# Patient Record
Sex: Male | Born: 1995 | Race: Black or African American | Hispanic: No | Marital: Single | Smoking: Never smoker
Health system: Southern US, Community
[De-identification: ages and names within clinical notes are randomized; demographics above are authoritative.]

## PROBLEM LIST (undated history)

## (undated) DIAGNOSIS — F32A Depression, unspecified: Secondary | ICD-10-CM

## (undated) DIAGNOSIS — F419 Anxiety disorder, unspecified: Secondary | ICD-10-CM

## (undated) DIAGNOSIS — F329 Major depressive disorder, single episode, unspecified: Secondary | ICD-10-CM

---

## 2017-09-01 ENCOUNTER — Encounter (HOSPITAL_COMMUNITY): Payer: Self-pay

## 2017-09-01 ENCOUNTER — Emergency Department (HOSPITAL_COMMUNITY)
Admission: EM | Admit: 2017-09-01 | Discharge: 2017-09-01 | Disposition: A | Discharge status: Home / Self Care | Payer: Self-pay | Attending: Emergency Medicine | Admitting: Emergency Medicine

## 2017-09-01 ENCOUNTER — Emergency Department (HOSPITAL_COMMUNITY): Payer: Self-pay

## 2017-09-01 DIAGNOSIS — F17200 Nicotine dependence, unspecified, uncomplicated: Secondary | ICD-10-CM | POA: Insufficient documentation

## 2017-09-01 DIAGNOSIS — R519 Headache, unspecified: Secondary | ICD-10-CM

## 2017-09-01 DIAGNOSIS — B9789 Other viral agents as the cause of diseases classified elsewhere: Secondary | ICD-10-CM

## 2017-09-01 DIAGNOSIS — R51 Headache: Secondary | ICD-10-CM | POA: Insufficient documentation

## 2017-09-01 DIAGNOSIS — R0981 Nasal congestion: Secondary | ICD-10-CM | POA: Insufficient documentation

## 2017-09-01 DIAGNOSIS — J069 Acute upper respiratory infection, unspecified: Secondary | ICD-10-CM | POA: Insufficient documentation

## 2017-09-01 LAB — I-STAT CHEM 8, ED
BUN: 9 mg/dL (ref 6–20)
CALCIUM ION: 1.22 mmol/L (ref 1.15–1.40)
CREATININE: 1 mg/dL (ref 0.61–1.24)
Chloride: 106 mmol/L (ref 98–111)
Glucose, Bld: 94 mg/dL (ref 70–99)
HEMATOCRIT: 50 % (ref 39.0–52.0)
Hemoglobin: 17 g/dL (ref 13.0–17.0)
Potassium: 4.9 mmol/L (ref 3.5–5.1)
Sodium: 139 mmol/L (ref 135–145)
TCO2: 24 mmol/L (ref 22–32)

## 2017-09-01 MED ORDER — DM-GUAIFENESIN ER 30-600 MG PO TB12: 1.0000 | ORAL_TABLET | Freq: Two times a day (BID) | ORAL | 0 refills | Status: DC

## 2017-09-01 MED ORDER — FLUTICASONE PROPIONATE 50 MCG/ACT NA SUSP: 1.0000 | Freq: Every day | NASAL | 2 refills | Status: DC

## 2017-09-01 MED ORDER — KETOROLAC TROMETHAMINE 30 MG/ML IJ SOLN
30.0000 mg | Freq: Once | INTRAMUSCULAR | Status: AC
  Administered 2017-09-01: 30 mg via INTRAVENOUS
  Filled 2017-09-01: qty 1

## 2017-09-01 MED ORDER — SODIUM CHLORIDE 0.9 % IV BOLUS
1000.0000 mL | Freq: Once | INTRAVENOUS | Status: AC
  Administered 2017-09-01: 1000 mL via INTRAVENOUS

## 2017-09-01 NOTE — ED Triage Notes (Signed)
Patient complains of 1 week of cough and congestion with frontal/right sided headache. Denies trauma. Has not taken any otc meds, NAD

## 2017-09-01 NOTE — ED Provider Notes (Signed)
[facility] EMERGENCY DEPARTMENT Provider Note   CSN: [id number] Arrival date & time: 09/01/17  1232     History   Chief Complaint Chief Complaint  Patient presents with  . cough/congestion/headache    HPI Jeremih Antonio Ferrell Claiborne is a 22 y.o. male with no significant past medical history who presents for evaluation of headache, nasal congestion and cough that has been ongoing for a week.  Patient states that when headache began, he was sitting down in his room.  He denies any thunderclap headache.  He states the headache gradually got worse.  He states that headache has been intermittent for the last week.  He states it will resolve on its own but then will come back.  He has not taken any medications for the pain.  She also reports some nasal congestion.  He reports dry cough.  He states that currently the headache is a 6/10.  He describes as a throbbing headache.  He has noted to mom that he has had some episodes of feeling generalized weakness and just "run down."  He states he has not had any preceding trauma, injury, fall.  Patient reports he has been able to the sleep with any difficulty.  States he had some mild blurry vision but states that it improved.  He has not had any return of blurry vision and states he is currently not having any vision changes.  He states that the headache is not worsened by any exertional activity.  Patient denies any fevers, chest pain, difficulty breathing, abdominal pain, nausea/vomiting, numbness/weakness of his arms or legs, difficulty with ambulating.  The history is provided by the patient.    History reviewed. No pertinent past medical history.  There are no active problems to display for this patient.   History reviewed. No pertinent surgical history.      Home Medications    Prior to Admission medications   Medication Sig Start Date End Date Taking? Authorizing Provider  naproxen (NAPROSYN) 500 MG tablet Take 1 tablet  (500 mg total) by mouth 2 (two) times daily. 09/03/17   Petrucelli, Samantha R, PA-C  penicillin v potassium (VEETID) 500 MG tablet Take 1 tablet (500 mg total) by mouth 3 (three) times daily for 7 days. 09/03/17 09/10/17  Petrucelli, Pleas Koch, PA-C    Family History No family history on file.  Social History Social History   Tobacco Use  . Smoking status: Current Every Day Smoker  . Smokeless tobacco: Never Used  Substance Use Topics  . Alcohol use: Never    Frequency: Never  . Drug use: Never     Allergies   Patient has no known allergies.   Review of Systems Review of Systems  Constitutional: Negative for fever.  HENT: Positive for congestion.   Eyes: Negative for visual disturbance.  Respiratory: Positive for cough. Negative for shortness of breath.   Cardiovascular: Negative for chest pain.  Gastrointestinal: Negative for abdominal pain, nausea and vomiting.  Genitourinary: Negative for dysuria and hematuria.  Neurological: Positive for headaches.  All other systems reviewed and are negative.    Physical Exam Updated Vital Signs BP 133/83 (BP Location: Right Arm)   Pulse 89   Temp 98.4 F (36.9 C) (Oral)   Resp 18   SpO2 100%   Physical Exam  Constitutional: He is oriented to person, place, and time. He appears well-developed and well-nourished.  HENT:  Head: Normocephalic and atraumatic.  Mouth/Throat: Oropharynx is clear and moist and mucous  membranes are normal.  Eyes: Pupils are equal, round, and reactive to light. Conjunctivae, EOM and lids are normal.  EOMs intact, difficulty.  No vertical, rotational nystagmus.  Neck: Full passive range of motion without pain. Neck supple. No neck rigidity.  Neck is supple without any rigidity.  Cardiovascular: Normal rate, regular rhythm, normal heart sounds and normal pulses. Exam reveals no gallop and no friction rub.  No murmur heard. Pulmonary/Chest: Effort normal and breath sounds normal.  Lungs clear to  auscultation bilaterally.  Symmetric chest rise.  No wheezing, rales, rhonchi.  Abdominal: Soft. Normal appearance. There is no tenderness. There is no rigidity and no guarding.  Musculoskeletal: Normal range of motion.  Neurological: He is alert and oriented to person, place, and time.  Cranial nerves III-XII intact Follows commands, Moves all extremities  5/5 strength to BUE and BLE  Sensation intact throughout all major nerve distributions Normal finger to nose. No dysdiadochokinesia. No pronator drift. No gait abnormalities  No slurred speech. No facial droop.  Negative Rhomberg  Skin: Skin is warm and dry. Capillary refill takes less than 2 seconds.  Psychiatric: He has a normal mood and affect. His speech is normal.  Nursing note and vitals reviewed.    ED Treatments / Results  Labs (all labs ordered are listed, but only abnormal results are displayed) Labs Reviewed  I-STAT CHEM 8, ED    EKG None  Radiology Dg Chest 2 View  Result Date: 09/01/2017 CLINICAL DATA:  Patient with cough.  Frontal headache. EXAM: CHEST - 2 VIEW COMPARISON:  None. FINDINGS: The heart size and mediastinal contours are within normal limits. Both lungs are clear. The visualized skeletal structures are unremarkable. IMPRESSION: No acute cardiopulmonary process. Electronically Signed   By: Annia Beltrew  Davis M.D.   On: 09/01/2017 17:02    Procedures Procedures (including critical care time)  Medications Ordered in ED Medications  sodium chloride 0.9 % bolus 1,000 mL (0 mLs Intravenous Stopped 09/01/17 1703)  ketorolac (TORADOL) 30 MG/ML injection 30 mg (30 mg Intravenous Given 09/01/17 1548)     Initial Impression / Assessment and Plan / ED Course  I have reviewed the triage vital signs and the nursing notes.  Pertinent labs & imaging results that were available during my care of the patient were reviewed by me and considered in my medical decision making (see chart for details).     22 yo male  who presents for evaluation of 1 week of nasal congestion, cough, headache.  Reports he feels "run down."  No history of trauma.  Has not taken any medications.  Headache is not worse with exertion.  It was a gradual onset headache.  No thunderclap. Patient is afebrile, non-toxic appearing, sitting comfortably on examinaytion table. Vital signs reviewed and stable.  No neuro deficits on exam.  Consider viral URI with cough and sinus headache.  History/physical exam is not concerning [facility]. CVA, meningitis, cavernous sinus venous thrombosis.  Will plan to give analgesics here in the ED and chest x-ray.  Patient with no red flag symptoms or neuro deficits.  No indication for CT head.  Reevaluation after analgesics.  Patient reports headache is completely resolved.  He is ambulating in the department any difficulty.  Vital signs are stable.  Patient signed out to Aviva KluverAlyssa Murray, PA-C with CXR pending. Suspect viral URI with cough. Patient with no headache currently. Anticipate discharge if CXR is unremarkable. Please see note from Clif[place]ray, [place]-C for further ED course.   Final Clinical  Impressions(s) / ED Diagnoses   Final diagnoses:  Viral URI with cough  Generalized headache    ED Discharge Orders         Ordered    dextromethorphan-guaiFENesin [facility] DM) 30-600 MG 12hr tablet  2 times daily,   Status:  Discontinued     09/01/17 1805    fluticasone (FLONASE) 50 MCG/ACT nasal spray  Daily,   Status:  Discontinued     09/01/17 1805           Maxwell Caul, PA-C 09/03/17 1154    Vanetta Mulders, MD 09/10/17 [phone]

## 2017-09-01 NOTE — ED Provider Notes (Signed)
  Physical Exam  BP 130/71 (BP Location: Right Arm)   Pulse 60   Temp 98.9 F (37.2 C) (Oral)   Resp 17   SpO2 100%   [facility], PA-C. Briefly, the patient is a 22 y.o. male with PMHx of  has no past medical history on file. here with cough, congestion, and frontal headache.  Patient reports symptoms have occurred since last week.  There is been no fever, chills, neck stiffness, or rash.  Patient reports nonproductive cough.  Patient denies nausea or vomiting.  Patient is in the back of my evaluation after receiving fluids and Toradol.  Labs Reviewed  I-STAT CHEM 8, ED    Course of Care:   Physical Exam  Constitutional: He appears well-developed and well-nourished. No distress.  Sitting comfortably in bed.  HENT:  Head: Normocephalic and atraumatic.  Eyes: Pupils are equal, round, and reactive to light. Conjunctivae and EOM are normal. Right eye exhibits no discharge. Left eye exhibits no discharge.  EOMs normal to gross examination.  Neck: Normal range of motion.  No nuchal rigidity.  Cardiovascular: Normal rate, regular rhythm and normal heart sounds.  No murmur heard. Intact, 2+ radial pulse.  Pulmonary/Chest: Effort normal and breath sounds normal. He has no wheezes. He has no rales.  Normal respiratory effort. Patient converses comfortably. No audible wheeze or stridor.  Abdominal: He exhibits no distension.  Musculoskeletal: Normal range of motion.  Neurological: He is alert.  Cranial nerves intact to gross observation. Patient moves extremities without difficulty.  Skin: Skin is warm and dry. He is not diaphoretic.  Psychiatric: He has a normal mood and affect. His behavior is normal. Judgment and thought content normal.  Nursing note and vitals reviewed.   ED Course/Procedures     Procedures  MDM   Patient is nontoxic-appearing, afebrile, and in no acute distress.  Do not suspect meningitis as cause of patient's pain as he has no  nuchal rigidity or meningismus, is afebrile and neurologically intact.  Patient reports feeling much better after fluids and Toradol, and stable for discharge.  I gave return precautions for any worsening symptoms, headaches or neck stiffness, or high fevers.  Patient is in understanding and agrees with the plan of care.       Elisha PonderMurray, Kyandre Okray B, PA-C 09/01/17 1805    Vanetta MuldersZackowski, Scott, MD 09/10/17 1721

## 2017-09-01 NOTE — Discharge Instructions (Signed)
You can take Tylenol or Ibuprofen as directed for pain. You can alternate Tylenol and Ibuprofen every 4 hours. If you take Tylenol at 1pm, then you can take Ibuprofen at 5pm. Then you can take Tylenol again at 9pm.   Follow-up with referred [facility] to establish primary care.  Return to emergency department for any fever, worsening cough, worsening headache, numbness/weakness of your arms or legs, difficulty walking, vision changes or any other worsening concerning symptoms.

## 2017-09-02 ENCOUNTER — Encounter (HOSPITAL_COMMUNITY): Payer: Self-pay | Admitting: Student

## 2017-09-02 ENCOUNTER — Other Ambulatory Visit: Payer: Self-pay

## 2017-09-02 ENCOUNTER — Emergency Department (HOSPITAL_COMMUNITY)
Admission: EM | Admit: 2017-09-02 | Discharge: 2017-09-03 | Disposition: A | Discharge status: Home / Self Care | Payer: Medicaid Other | Attending: Emergency Medicine | Admitting: Emergency Medicine

## 2017-09-02 DIAGNOSIS — F17200 Nicotine dependence, unspecified, uncomplicated: Secondary | ICD-10-CM | POA: Insufficient documentation

## 2017-09-02 DIAGNOSIS — R6884 Jaw pain: Secondary | ICD-10-CM | POA: Insufficient documentation

## 2017-09-02 DIAGNOSIS — R51 Headache: Secondary | ICD-10-CM | POA: Insufficient documentation

## 2017-09-02 NOTE — ED Provider Notes (Signed)
[facility] EMERGENCY DEPARTMENT Provider Note   CSN: [id number] Arrival date & time: 09/02/17  2209     History   Chief Complaint Chief Complaint  Patient presents with  . Facial Pain    (right jaw)    HPI Bryan Kirk is a 22 y.o. male with a history of tobacco abuse who returns to the emergency department with complaints of headache for the past 1 week.  Patient states pain is most prominent to the right jaw, it seems to radiate into the right ear and general right side of the head. He was having some tooth pain in the R jaw prior to onset of headache. He had gradual onset with steady progression.  Pain has been constant for the past 1 week, no specific alleviating or aggravating factors other than Toradol given in the emergency department during last visit.  He states he has not tried intervention prior to arrival at home.  He has additionally had some congestion/rhinorrhea and felt generally poorly/fatigued.  Denies fever, chills, intraoral drainage, trouble swallowing, trouble moving the neck, vomiting, diarrhea, chest pain, or trouble breathing.  Denies change in vision, focal weakness, dizziness, or numbness-triage note mentions numbness, I clarified with patient regarding this several times, persistently denies this. Denies rash or tic exposures. Patient has not had his wisdom teeth extracted.  He does not see a dentist regularly. HPI  No past medical history on file.  There are no active problems to display for this patient.   No past surgical history on file.      Home Medications    Prior to Admission medications   Medication Sig Start Date End Date Taking? Authorizing Provider  dextromethorphan-guaiFENesin (MUCINEX DM) 30-600 MG 12hr tablet Take 1 tablet by mouth 2 (two) times daily for 7 days. 09/01/17 09/08/17  Aviva Kluver B, PA-C  fluticasone (FLONASE) 50 MCG/ACT nasal spray Place 1 spray into both nostrils daily. 09/01/17   Elisha Ponder, PA-C    Family History No family history on file.  Social History Social History   Tobacco Use  . Smoking status: Current Every Day Smoker  . Smokeless tobacco: Never Used  Substance Use Topics  . Alcohol use: Never    Frequency: Never  . Drug use: Never     Allergies   Patient has no known allergies.   Review of Systems Review of Systems  Constitutional: Negative for chills, fatigue and fever.  HENT: Positive for congestion, dental problem, ear pain and rhinorrhea. Negative for drooling, tinnitus, trouble swallowing and voice change.   Respiratory: Negative for shortness of breath.   Cardiovascular: Negative for chest pain.  Gastrointestinal: Negative for blood in stool, constipation, diarrhea, nausea and vomiting.  Musculoskeletal: Negative for neck stiffness.  Skin: Negative for rash.  Neurological: Positive for weakness (generalized) and headaches. Negative for dizziness, seizures, syncope and numbness.  All other systems reviewed and are negative.   Physical Exam Updated Vital Signs BP (!) 149/80 (BP Location: Right Arm)   Pulse 69   Temp 98.1 F (36.7 C) (Oral)   Resp (!) 23   Ht 5\' 6"  (1.676 m)   Wt 81.6 kg   SpO2 100%   BMI 29.05 kg/m   Physical Exam  Constitutional: He appears well-developed and well-nourished.  Non-toxic appearance. No distress.  HENT:  Head: Normocephalic and atraumatic.  Right Ear: No mastoid tenderness. Tympanic membrane is not perforated, not erythematous, not retracted and not bulging.  Left Ear: No mastoid  tenderness. Tympanic membrane is not perforated, not erythematous, not retracted and not bulging.  Nose: Mucosal edema present.  Mouth/Throat: Uvula is midline and oropharynx is clear and moist. No oropharyngeal exudate or posterior oropharyngeal erythema.    Patient has some tenderness to palpation over the R jaw, no palpable crepitus or instability. No palpable induration/fluctuance.   Patient has non  obstructing cerumen present in bilateral EACs. No erythema/purulent drainage.   Patient is tolerating his own secretions without difficulty.  No trismus.  No drooling.  No hot potato voice.  Submandibular compartment is soft.  Eyes: Pupils are equal, round, and reactive to light. Conjunctivae and EOM are normal. Right eye exhibits no discharge. Left eye exhibits no discharge.  No proptosis  Neck: Normal range of motion and full passive range of motion without pain. Neck supple. No neck rigidity. No edema and no erythema present.  Cardiovascular: Normal rate and regular rhythm.  Pulmonary/Chest: Effort normal and breath sounds normal. No respiratory distress. He has no wheezes. He has no rhonchi. He has no rales.  Respiration even and unlabored  Abdominal: Soft. He exhibits no distension. There is no tenderness.  Lymphadenopathy:    He has no cervical adenopathy.  Neurological: He is alert.  Alert. Clear speech. No facial droop. CNIII-XII grossly intact. Bilateral upper and lower extremities' sensation grossly intact. 5/5 symmetric strength with grip strength and with plantar and dorsi flexion bilaterally. . Normal finger to nose bilaterally. Negative pronator drift. Negative . Gait is steady and intact.    Skin: Skin is warm and dry. No rash noted.  Psychiatric: He has a normal mood and affect. His behavior is normal. Thought content normal.  Nursing note and vitals reviewed.    ED Treatments / Results  Labs (all labs ordered are listed, but only abnormal results are displayed) Labs Reviewed - No data to display  EKG None  Radiology  Procedures Procedures (including critical care time)  Medications Ordered in ED Medications  penicillin v potassium (VEETID) tablet 500 mg (500 mg Oral Given 09/03/17 0040)  naproxen (NAPROSYN) tablet 500 mg (500 mg Oral Given 09/03/17 0040)     Initial Impression / Assessment and Plan / ED Course  I have reviewed the triage vital signs and the  nursing notes.  Pertinent labs & imaging results that were available during my care of the patient were reviewed by me and considered in my medical decision making (see chart for details).   Patient presents to the emergency department with complaint of right jaw pain leading to right-sided headache.  Patient is nontoxic-appearing, in no apparent distress, vitals without significant abnormality. Patient's exam is fairly benign.  His headache appears to be originating from the jaw, he has no focal neurologic deficits, no nuchal rigidity, afebrile, no dizziness, no change in vision, no proptosis- non concerning for [facility], ICH, ischemic CVA, dural venous sinus thrombosis, mass, or meningitis.  He does have some tenderness over the right jaw as well as the right upper gingiva just posterior to the third right upper molar with some mild erythema.  There is no exposed tooth.  No gross abscess.  No evidence of Ludwig's angina.  Suspect pain is dental in origin, possibly due to wisdom teeth. With start patient on penicillin VK and naproxen with dental and PCP follow up. Dental resources provided. I discussed treatment plan, need for follow-up, and return precautions with the patient and his parents at bedside. Provided opportunity for questions, patient and his parents confirmed understanding and  are in agreement with plan.   Final Clinical Impressions(s) / ED Diagnoses   Final diagnoses:  Jaw pain    ED Discharge Orders         Ordered    penicillin v potassium (VEETID) 500 MG tablet  3 times daily     09/03/17 0033    naproxen (NAPROSYN) 500 MG tablet  2 times daily     09/03/17 0033           Cherly Anderson, PA-C 09/03/17 0540    Glynn Octave, MD 09/03/17 [phone]

## 2017-09-02 NOTE — ED Triage Notes (Addendum)
Patient c/o right sided jaw pain/HA and numbness x 2 weeks. Also states generally feels bad so has not been to work in the last 4-5 days. Seen yesterday for same.

## 2017-09-03 MED ORDER — PENICILLIN V POTASSIUM 500 MG PO TABS: 500.0000 mg | ORAL_TABLET | Freq: Three times a day (TID) | ORAL | 0 refills | Status: AC

## 2017-09-03 MED ORDER — NAPROXEN 250 MG PO TABS
500.0000 mg | ORAL_TABLET | Freq: Once | ORAL | Status: AC
  Administered 2017-09-03: 500 mg via ORAL
  Filled 2017-09-03: qty 2

## 2017-09-03 MED ORDER — NAPROXEN 500 MG PO TABS: 500.0000 mg | ORAL_TABLET | Freq: Two times a day (BID) | ORAL | 0 refills | Status: DC

## 2017-09-03 MED ORDER — PENICILLIN V POTASSIUM 250 MG PO TABS
500.0000 mg | ORAL_TABLET | Freq: Once | ORAL | Status: AC
  Administered 2017-09-03: 500 mg via ORAL
  Filled 2017-09-03: qty 2

## 2017-09-03 NOTE — Discharge Instructions (Signed)
You are seen in the emergency department today for a headache/jaw pain.  We suspect that this is coming from your teeth.  Call one of the dentists offices provided to schedule an appointment for re-evaluation and further management within the next 48 hours.  Additionally follow-up with your primary care provider within 1 week.  I have prescribed you Penicillin VK which is an antibiotic to treat the infection and Naproxen which is an anti-inflammatory medicine to treat the pain.   Please take all of your antibiotics until finished. You may develop abdominal discomfort or diarrhea from the antibiotic.  You may help offset this with probiotics which you can buy at the store (ask your pharmacist if unable to find) or get probiotics in the form of eating yogurt. Do not eat or take the probiotics until 2 hours after your antibiotic. If you are unable to tolerate these side effects follow-up with your primary care provider or return to the emergency department.   If you begin to experience any blistering, rashes, swelling, or difficulty breathing seek medical care for evaluation of potentially more serious side effects.   Be sure to eat something when taking the Naproxen as it can cause stomach upset and at worst stomach bleeding. Do not take additional non steroidal anti-inflammatory medicines such as Ibuprofen, Aleve, Advil, Mobic, Diclofenac, or goodie powder while taking Naproxen. You may supplement with Tylenol.   Please be aware that these medications may interact with other medications you are taking, please be sure to discuss your medication list with your pharmacist.  If you start to experience and new or worsening symptoms return to the emergency department. If you start to experience fever, chills, neck stiffness/pain, or inability to move your neck or open your mouth come back to the emergency department immediately.

## 2017-11-05 ENCOUNTER — Encounter (HOSPITAL_COMMUNITY): Payer: Self-pay | Admitting: Emergency Medicine

## 2017-11-05 ENCOUNTER — Ambulatory Visit (HOSPITAL_COMMUNITY)
Admission: EM | Admit: 2017-11-05 | Discharge: 2017-11-05 | Disposition: A | Discharge status: Home / Self Care | Payer: Self-pay | Attending: Family Medicine | Admitting: Family Medicine

## 2017-11-05 DIAGNOSIS — J069 Acute upper respiratory infection, unspecified: Secondary | ICD-10-CM

## 2017-11-05 MED ORDER — BENZONATATE 200 MG PO CAPS: 200.0000 mg | ORAL_CAPSULE | Freq: Two times a day (BID) | ORAL | 0 refills | Status: DC | PRN

## 2017-11-05 MED ORDER — IBUPROFEN 800 MG PO TABS: 800.0000 mg | ORAL_TABLET | Freq: Three times a day (TID) | ORAL | 0 refills | Status: DC

## 2017-11-05 NOTE — Discharge Instructions (Addendum)
Drink plenty of fluids Ibuprofen 3 times a day for pain and fever Take ibuprofen with food Take Tessalon as needed for cough Off work for 2 days Return if you are worse instead of better, or fail to see improvement in several days

## 2017-11-05 NOTE — ED Triage Notes (Signed)
Pt sts URI sx with fever  

## 2017-11-05 NOTE — ED Provider Notes (Addendum)
[facility]    CSN: [id number] Arrival date & time: 11/05/17  1442     History   Chief Complaint Chief Complaint  Patient presents with  . URI    HPI Bryan Kirk is a 22 y.o. male.   HPI Patient has cough, runny nose, fever for 3 days.  He was supposed to work today and needs a work note.  He is clear rhinorrhea.  Clear sputum production.  No chest pain.  No nausea vomiting diarrhea.  No ear pressure or pain.  No headache.  No sore throat or exposure to strep.  No rash.  No pressure pain in the sinuses or significant headache.  No underlying asthma or allergies  History reviewed. No pertinent past medical history.  There are no active problems to display for this patient.   History reviewed. No pertinent surgical history.     Home Medications    Prior to Admission medications   Medication Sig Start Date End Date Taking? Authorizing Provider  benzonatate (TESSALON) 200 MG capsule Take 1 capsule (200 mg total) by mouth 2 (two) times daily as needed for cough. 11/05/17   Eustace Moore, MD  ibuprofen (ADVIL,MOTRIN) 800 MG tablet Take 1 tablet (800 mg total) by mouth 3 (three) times daily. 11/05/17   Eustace Moore, MD    Family History History reviewed. No pertinent family history.  Social History Social History   Tobacco Use  . Smoking status: Current Every Day Smoker  . Smokeless tobacco: Never Used  Substance Use Topics  . Alcohol use: Never    Frequency: Never  . Drug use: Never     Allergies   Patient has no known allergies.   Review of Systems Review of Systems  Constitutional: Positive for chills, diaphoresis and fever.  HENT: Positive for congestion and rhinorrhea. Negative for ear pain, sinus pressure, sneezing and sore throat.   Eyes: Negative for pain and visual disturbance.  Respiratory: Positive for cough. Negative for shortness of breath.   Cardiovascular: Negative for chest pain and palpitations.    Gastrointestinal: Negative for abdominal pain and vomiting.  Genitourinary: Negative for dysuria and hematuria.  Musculoskeletal: Positive for myalgias. Negative for arthralgias and back pain.  Skin: Negative for color change and rash.  Neurological: Negative for seizures and syncope.  Psychiatric/Behavioral: Negative for confusion and decreased concentration.  All other systems reviewed and are negative.    Physical Exam Triage Vital Signs ED Triage Vitals  Enc Vitals Group     BP 11/05/17 1505 (!) 117/95     Pulse Rate 11/05/17 1505 (!) 108     Resp 11/05/17 1505 18     Temp 11/05/17 1505 99 F (37.2 C)     Temp Source 11/05/17 1505 Oral     SpO2 11/05/17 1505 100 %     Weight --      Height --      Head Circumference --      Peak Flow --      Pain Score 11/05/17 1506 5     Pain Loc --      Pain Edu? --      Excl. in GC? --    No data found.  Updated Vital Signs BP (!) 117/95 (BP Location: Right Arm)   Pulse (!) 108   Temp 99 F (37.2 C) (Oral)   Resp 18   SpO2 100%         Physical Exam  Constitutional: He appears  well-developed and well-nourished. No distress.  HENT:  Head: Normocephalic and atraumatic.  Right Ear: External ear normal.  Left Ear: External ear normal.  Mouth/Throat: Oropharynx is clear and moist.  Nose congested, clear rhinorrhea, no sinus tenderness.  Posterior pharynx is clear  Eyes: Pupils are equal, round, and reactive to light. Conjunctivae are normal.  Neck: Normal range of motion.  Cardiovascular: Normal rate.  Pulmonary/Chest: Effort normal. No respiratory distress.  Abdominal: Soft. He exhibits no distension.  Musculoskeletal: Normal range of motion. He exhibits no edema.  Neurological: He is alert.  Skin: Skin is warm and dry.  Psychiatric: He has a normal mood and affect. His behavior is normal.     UC Treatments / Results  Labs (all labs ordered are listed, but only abnormal results are displayed) Labs Reviewed - No  data to display  EKG None  Radiology No results found.  Procedures Procedures (including critical care time)  Medications Ordered in UC Medications - No data to display  Initial Impression / Assessment and Plan / UC Course  I have reviewed the triage vital signs and the nursing notes.  Pertinent labs & imaging results that were available during my care of the patient were reviewed by me and considered in my medical decision making (see chart for details).     Patient has a viral illness.  I told him he will not improve with antibiotics and they could do harm.  He agrees.  Discussed symptomatic care.  Off work 2 days.  Return if worse instead of better. Final Clinical Impressions(s) / UC Diagnoses   Final diagnoses:  Viral upper respiratory tract infection     Discharge Instructions     Drink plenty of fluids Ibuprofen 3 times a day for pain and fever Take ibuprofen with food Take Tessalon as needed for cough Off work for 2 days Return if you are worse instead of better, or fail to see improvement in several days    ED Prescriptions    Medication Sig Dispense Auth. Provider   ibuprofen (ADVIL,MOTRIN) 800 MG tablet Take 1 tablet (800 mg total) by mouth 3 (three) times daily. 21 tablet Eustace Moore, MD   benzonatate (TESSALON) 200 MG capsule Take 1 capsule (200 mg total) by mouth 2 (two) times daily as needed for cough. 20 capsule Eustace Moore, MD     Controlled Substance Prescriptions [place] Controlled Substance Registry consulted? Not Applicable   Eustace Moore, MD 11/05/17 1608    Eustace Moore, MD 11/05/17 [phone]

## 2017-12-13 ENCOUNTER — Emergency Department (HOSPITAL_COMMUNITY)
Admission: EM | Admit: 2017-12-13 | Discharge: 2017-12-13 | Disposition: A | Discharge status: Home / Self Care | Payer: Self-pay | Attending: Emergency Medicine | Admitting: Emergency Medicine

## 2017-12-13 ENCOUNTER — Encounter (HOSPITAL_COMMUNITY): Payer: Self-pay | Admitting: *Deleted

## 2017-12-13 DIAGNOSIS — F23 Brief psychotic disorder: Secondary | ICD-10-CM | POA: Insufficient documentation

## 2017-12-13 DIAGNOSIS — F22 Delusional disorders: Secondary | ICD-10-CM | POA: Insufficient documentation

## 2017-12-13 DIAGNOSIS — F17200 Nicotine dependence, unspecified, uncomplicated: Secondary | ICD-10-CM | POA: Insufficient documentation

## 2017-12-13 DIAGNOSIS — F1220 Cannabis dependence, uncomplicated: Secondary | ICD-10-CM | POA: Insufficient documentation

## 2017-12-13 LAB — CBC WITH DIFFERENTIAL/PLATELET
Abs Immature Granulocytes: 0.02 10*3/uL (ref 0.00–0.07)
BASOS PCT: 0 %
Basophils Absolute: 0 10*3/uL (ref 0.0–0.1)
EOS ABS: 0 10*3/uL (ref 0.0–0.5)
EOS PCT: 0 %
HCT: 49.6 % (ref 39.0–52.0)
Hemoglobin: 16.2 g/dL (ref 13.0–17.0)
IMMATURE GRANULOCYTES: 0 %
LYMPHS ABS: 1.7 10*3/uL (ref 0.7–4.0)
Lymphocytes Relative: 29 %
MCH: 28.9 pg (ref 26.0–34.0)
MCHC: 32.7 g/dL (ref 30.0–36.0)
MCV: 88.4 fL (ref 80.0–100.0)
MONOS PCT: 6 %
Monocytes Absolute: 0.3 10*3/uL (ref 0.1–1.0)
NEUTROS PCT: 65 %
Neutro Abs: 3.6 10*3/uL (ref 1.7–7.7)
PLATELETS: 285 10*3/uL (ref 150–400)
RBC: 5.61 MIL/uL (ref 4.22–5.81)
RDW: 12.3 % (ref 11.5–15.5)
WBC: 5.7 10*3/uL (ref 4.0–10.5)
nRBC: 0 % (ref 0.0–0.2)

## 2017-12-13 LAB — COMPREHENSIVE METABOLIC PANEL
ALBUMIN: 4.6 g/dL (ref 3.5–5.0)
ALK PHOS: 62 U/L (ref 38–126)
ALT: 14 U/L (ref 0–44)
ANION GAP: 9 (ref 5–15)
AST: 16 U/L (ref 15–41)
BUN: 12 mg/dL (ref 6–20)
CALCIUM: 9.3 mg/dL (ref 8.9–10.3)
CHLORIDE: 105 mmol/L (ref 98–111)
CO2: 23 mmol/L (ref 22–32)
CREATININE: 1.03 mg/dL (ref 0.61–1.24)
GFR calc Af Amer: >60 mL/min (ref >60)
GFR calc non Af Amer: >60 mL/min (ref >60)
GLUCOSE: 105 mg/dL — AB (ref 70–99)
Potassium: 4.9 mmol/L (ref 3.5–5.1)
SODIUM: 137 mmol/L (ref 135–145)
Total Bilirubin: 0.9 mg/dL (ref 0.3–1.2)
Total Protein: 7.5 g/dL (ref 6.5–8.1)

## 2017-12-13 LAB — RAPID URINE DRUG SCREEN, HOSP PERFORMED
AMPHETAMINES: NOT DETECTED
BARBITURATES: NOT DETECTED
Benzodiazepines: NOT DETECTED
COCAINE: NOT DETECTED
Opiates: NOT DETECTED
TETRAHYDROCANNABINOL: POSITIVE — AB

## 2017-12-13 LAB — ETHANOL: Alcohol, Ethyl (B): <10 mg/dL (ref <10)

## 2017-12-13 NOTE — ED Triage Notes (Signed)
Pt in c/o paranoia and anxiety, symptoms have been worsening in recent weeks and is started to effect his daily life, no history of same, denies SI/HI, denies hallucinations

## 2017-12-13 NOTE — ED Provider Notes (Signed)
[facility] Emergency Department Provider Note MRN:  [id number]  Arrival date & time: 12/13/17     Chief Complaint   Anxiety   History of Present Illness   Bryan Kirk is a 22 y.o. year-old male with no pertinent past medical history presenting to the ED with chief complaint of anxiety.  Patient endorsing about 3 weeks of paranoid thoughts and behaviors.  Whenever he is left alone in the home, he has persistent thoughts that someone is going to break into the home.  Consumes his thoughts.  More recently at work this past Friday, he called his mother to pick him up and per mother he was very anxious and distraught.  He was convinced that everyone at work was staring at him and did not want him there.  More recently interfering with his life.  Patient also endorsing auditory hallucinations, hearing someone else's voice, mostly random phrases or thoughts, sometimes telling him to cut himself.  Denies suicide ideation, no homicidal ideation, no visual hallucinations, marijuana use but no other drug use.  Review of Systems  A complete 10 system review of systems was obtained and all systems are negative except as noted in the HPI and PMH.   Patient's Health History   History reviewed. No pertinent past medical history.  History reviewed. No pertinent surgical history.  History reviewed. No pertinent family history.  Social History   Socioeconomic History  . Marital status: Single    Spouse name: Not on file  . Number of children: Not on file  . Years of education: Not on file  . Highest education level: Not on file  Occupational History  . Not on file  Social Needs  . Financial resource strain: Not on file  . Food insecurity:    Worry: Not on file    Inability: Not on file  . Transportation needs:    Medical: Not on file    Non-medical: Not on file  Tobacco Use  . Smoking status: Current Every Day Smoker  . Smokeless tobacco: Never Used  Substance  and Sexual Activity  . Alcohol use: Never    Frequency: Never  . Drug use: Yes    Types: Marijuana    Comment: last use 12/13/17  . Sexual activity: Not on file  Lifestyle  . Physical activity:    Days per week: Not on file    Minutes per session: Not on file  . Stress: Not on file  Relationships  . Social connections:    Talks on phone: Not on file    Gets together: Not on file    Attends religious service: Not on file    Active member of club or organization: Not on file    Attends meetings of clubs or organizations: Not on file    Relationship status: Not on file  . Intimate partner violence:    Fear of current or ex partner: Not on file    Emotionally abused: Not on file    Physically abused: Not on file    Forced sexual activity: Not on file  Other Topics Concern  . Not on file  Social History Narrative  . Not on file     Physical Exam  Vital Signs and Nursing Notes reviewed There were no vitals filed for this visit.  CONSTITUTIONAL: Well-appearing, NAD NEURO:  Alert and oriented x 3, no focal deficits EYES:  eyes equal and reactive ENT/NECK:  no LAD, no JVD CARDIO: Regular rate, well-perfused,  normal S1 and S2 PULM:  CTAB no wheezing or rhonchi GI/GU:  normal bowel sounds, non-distended, non-tender MSK/SPINE:  No gross deformities, no edema SKIN:  no rash, atraumatic PSYCH:  Appropriate speech and behavior; mildly paranoid  Diagnostic and Interventional Summary    EKG Interpretation  Date/Time:    Ventricular Rate:    PR Interval:    QRS Duration:   QT Interval:    QTC Calculation:   R Axis:     Text Interpretation:        Labs Reviewed  COMPREHENSIVE METABOLIC PANEL - Abnormal; Notable for the following components:      Result Value   Glucose, Bld 105 (*)    All other components within normal limits  RAPID URINE DRUG SCREEN, HOSP PERFORMED - Abnormal; Notable for the following components:   Tetrahydrocannabinol POSITIVE (*)    All other  components within normal limits  ETHANOL  CBC WITH DIFFERENTIAL/PLATELET    No orders to display    Medications - No data to display   Procedures Critical Care  ED Course and Medical Decision Making  I have reviewed the triage vital signs and the nursing notes.  Pertinent labs & imaging results that were available during my care of the patient were reviewed by me and considered in my medical decision making (see below for details).  Question of early or new diagnosis of schizophrenia in this 22 year old male with new paranoid thoughts with auditory hallucinations.  Currently does not appear to be a harm to himself or others, but given the impact on his life, will consult TTS for recommendations.  Behavioral health favoring paranoia related to excessive marijuana use.  Patient safe for outpatient management, advised to refrain from marijuana use.  After the discussed management above, the patient was determined to be safe for discharge.  The patient was in agreement with this plan and all questions regarding their care were answered.  ED return precautions were discussed and the patient will return to the ED with any significant worsening of condition.  Elmer SowMichael M. Pilar PlateBero, MD [facility] [email or web address]  Final Clinical Impressions(s) / ED Diagnoses     ICD-10-CM   1. Paranoid behavior [facility](HCC) F22     ED Discharge Orders    None         Sabas SousBero, Michael M, MD 12/13/17 [phone]

## 2017-12-13 NOTE — BH Assessment (Signed)
NP Kayren Eavesakia Starks recommends the pt be psych cleared and to refrain from using marijuana.  MD Pilar PlateBero was made aware of the recommendation.

## 2017-12-13 NOTE — BH Assessment (Addendum)
Tele Assessment Note   Patient Name: Bryan Kirk MRN: [id number] Referring Physician: Pilar Plate Location of Patient: [facility] ED Location of Provider: Behavioral Health TTS Department  [facility] Bryan Kirk is an 22 y.o. male.  The pt came in due to feeling paranoid and anxious.  The pt denies SI and HI.  He stated when he is at home he feels like someone is going to break into the home or someone is going to try to get him  The pt has been feeling this way for the past week.  Today, the pt was at home and felt someone was going to break into the home.  The pt also stated he will occasionally hear people talking in the background, where there is no one there.  The pt smokes about a gram of mariajuana daily with his last usage being earlier this morning.   The pt's mother denies any mental health history in the family.  The pt has never been to a counselor, psychiatrist and hasn't been inpatient in the past.  The pt lives with his mother, step father, and two brothers (62 and 63).  The pt stated he gets along with everyone in the home.  The pt denies SI, self harm, HI, legal issues, and history of abuse.  The pt reported he is not sleeping well and his appetite is up and down.    Pt is dressed in casual clothes. He is alert and oriented x4. Pt speaks in a clear tone, at moderate volume and normal pace. Eye contact is good. Pt's mood is pleasant. Thought process is coherent and relevant. There is no indication Pt is currently responding to internal stimuli or experiencing delusional thought content.?Pt was cooperative throughout assessment.       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 No pertinent past medical history.  History reviewed. No pertinent surgical history.  Family History: History reviewed. No pertinent family history.  Social History:  reports that he has been smoking. He has never used  smokeless tobacco. He reports that he does not drink alcohol or use drugs.  Additional Social History:  Alcohol / Drug Use Pain Medications: See MAR Prescriptions: See MAR Over the Counter: See MAR History of alcohol / drug use?: Yes Longest period of sobriety (when/how long): NA Substance #1 Name of Substance 1: marijuana 1 - Age of First Use: 19 1 - Amount (size/oz): one gram 1 - Frequency: daily 1 - Duration: 3 years 1 - Last Use / Amount: "This morning"  CIWA:   COWS:    Allergies: No Known Allergies  Home Medications:  (Not in a hospital admission)  OB/GYN Status:  No LMP for male patient.  General Assessment Data Location of Assessment: [facility] ED TTS Assessment: In system Is this a Tele or Face-to-Face Assessment?: Face-to-Face Is this an Initial Assessment or a Re-assessment for this encounter?: Initial Assessment Patient Accompanied by:: Parent Language Other than English: No Living Arrangements: Other (Comment)(home) What gender do you identify as?: Male Marital status: Single Maiden name: NA Pregnancy Status: No Living Arrangements: Parent, Other relatives Can pt return to current living arrangement?: Yes Admission Status: Voluntary Is patient capable of signing voluntary admission?: Yes Referral Source: Self/Family/Friend Insurance type: Self Pay     Crisis Care Plan Living Arrangements: Parent, Other relatives Legal Guardian: Other:(self) Name of Psychiatrist: none Name of Therapist: none  Education Status Is patient currently in school?: No Is  the patient employed, unemployed or receiving disability?: Unemployed  Risk to self with the past 6 months Suicidal Ideation: No Has patient been a risk to self within the past 6 months prior to admission? : No Suicidal Intent: No Has patient had any suicidal intent within the past 6 months prior to admission? : No Is patient at risk for suicide?: No Suicidal Plan?: No Has patient had any suicidal plan  within the past 6 months prior to admission? : No Access to Means: No What has been your use of drugs/alcohol within the last 12 months?: marijuana Previous Attempts/Gestures: No How many times?: 0 Other Self Harm Risks: none Triggers for Past Attempts: None known Intentional Self Injurious Behavior: None Family Suicide History: No Recent stressful life event(s): Other (Comment)(denies any major stressors) Persecutory voices/beliefs?: Yes Depression: No Substance abuse history and/or treatment for substance abuse?: Yes Suicide prevention information given to non-admitted patients: Not applicable  Risk to Others within the past 6 months Homicidal Ideation: No Does patient have any lifetime risk of violence toward others beyond the six months prior to admission? : No Thoughts of Harm to Others: No Current Homicidal Intent: No Current Homicidal Plan: No Access to Homicidal Means: No Identified Victim: none History of harm to others?: No Assessment of Violence: None Noted Violent Behavior Description: none Does patient have access to weapons?: No Criminal Charges Pending?: No Does patient have a court date: No Is patient on probation?: No  Psychosis Hallucinations: Auditory Delusions: Persecutory  Mental Status Report Appearance/Hygiene: Unremarkable Eye Contact: Good Motor Activity: Freedom of movement, Unremarkable Speech: Logical/coherent Level of Consciousness: Alert Mood: Pleasant Affect: Appropriate to circumstance Anxiety Level: None Thought Processes: Coherent, Relevant Judgement: Partial Orientation: Situation, Time, Place, Person Obsessive Compulsive Thoughts/Behaviors: None  Cognitive Functioning Concentration: Normal Memory: Recent Intact, Remote Intact Is patient IDD: No Insight: Fair Impulse Control: Good Appetite: Fair Have you had any weight changes? : No Change Sleep: No Change Total Hours of Sleep: 8 Vegetative Symptoms: None  ADLScreening  [facility] Assessment Services) Patient's cognitive ability adequate to safely complete daily activities?: Yes Patient able to express need for assistance with ADLs?: Yes Independently performs ADLs?: Yes (appropriate for developmental age)  Prior Inpatient Therapy Prior Inpatient Therapy: No  Prior Outpatient Therapy Prior Outpatient Therapy: No Does patient have an ACCT team?: No Does patient have Intensive In-House Services?  : No Does patient have [facility] services? : No Does patient have P4CC services?: No  ADL Screening (condition at time of admission) Patient's cognitive ability adequate to safely complete daily activities?: Yes Patient able to express need for assistance with ADLs?: Yes Independently performs ADLs?: Yes (appropriate for developmental age)       Abuse/Neglect Assessment (Assessment to be complete while patient is alone) Abuse/Neglect Assessment Can Be Completed: Yes Physical Abuse: Denies Verbal Abuse: Denies Sexual Abuse: Denies Exploitation of patient/patient's resources: Denies Self-Neglect: Denies Values / Beliefs Cultural Requests During Hospitalization: None Spiritual Requests During Hospitalization: None Consults Spiritual Care Consult Needed: No Social Work Consult Needed: No            Disposition:  Disposition Initial Assessment Completed for this Encounter: Yes NP Kayren Eavesakia Starks recommends the pt be psych cleared and to refrain from using marijuana.  MD Pilar PlateBero was made aware of the recommendation.  This service was provided via telemedicine using a 2-way, interactive audio and video technology.  Names of all persons participating in this telemedicine service and their role in this encounter. Name: Bryan MilchDavion Kirk Role: Pt  Name: Bryan Kirk Role: Pt's mother  Name: Bryan Kirk Role: Pt's step father  Name: Riley Churches Role: TTS    Riley Churches [facility] 12/13/2017 2:14 PM

## 2017-12-13 NOTE — BH Assessment (Addendum)
Multiple [facility] have been [facility] to [facility] ([phone], [phone], and [phone]), there have been messages left and there has not been a call back at this time for the pt's disposition.  TTS will contact ED staff when there is [facility].

## 2017-12-13 NOTE — Discharge Instructions (Addendum)
You were evaluated in the Emergency Department and after careful evaluation, we did not find any emergent condition requiring admission or further testing in the hospital.  Your symptoms today seem to be due to paranoia related to marijuana use.  Please refrain from marijuana use and follow-up closely with the behavioral health resources provided.  Please return to the Emergency Department if you experience any worsening of your condition.  We encourage you to follow up with a primary care provider.  Thank you for allowing us to be a part of your care.

## 2017-12-14 ENCOUNTER — Other Ambulatory Visit: Payer: Self-pay

## 2017-12-14 ENCOUNTER — Encounter (HOSPITAL_COMMUNITY): Payer: Self-pay

## 2017-12-14 ENCOUNTER — Emergency Department (HOSPITAL_COMMUNITY)
Admission: EM | Admit: 2017-12-14 | Discharge: 2017-12-14 | Disposition: A | Discharge status: Home / Self Care | Payer: Medicaid Other | Attending: Emergency Medicine | Admitting: Emergency Medicine

## 2017-12-14 DIAGNOSIS — F419 Anxiety disorder, unspecified: Secondary | ICD-10-CM | POA: Insufficient documentation

## 2017-12-14 DIAGNOSIS — F22 Delusional disorders: Secondary | ICD-10-CM | POA: Insufficient documentation

## 2017-12-14 DIAGNOSIS — F17210 Nicotine dependence, cigarettes, uncomplicated: Secondary | ICD-10-CM | POA: Insufficient documentation

## 2017-12-14 MED ORDER — HYDROXYZINE HCL 25 MG PO TABS: 25.0000 mg | ORAL_TABLET | Freq: Four times a day (QID) | ORAL | 0 refills | Status: DC

## 2017-12-14 NOTE — ED Provider Notes (Signed)
[facility] EMERGENCY DEPARTMENT Provider Note   CSN: [id number] Arrival date & time: 12/14/17  [id number]   History   Chief Complaint Chief Complaint  Patient presents with  . Paranoid    HPI Bryan Kirk is a 22 y.o. male.  HPI    [age] year old male presents today with complaints of anxiety and paranoia.  Patient and his family notes that over the last month he has had paranormal thoughts.  Patient notes that he gets scared when his family leaves as if somebody is can accommodate the house and hurt him.  Patient also notes that he was working and had to stop working because he felt people wanted to hurt him.  Patient notes that he occasionally sees shadows and hears background voices when he wakes up in the morning.  He notes passive suicidal thoughts, none presently denies any homicidal thoughts or behaviors.  Patient notes that he uses marijuana but does not use any other drugs or alcohol.  Patient's mother notes that he has had significant stress lately as he has stopped working, was staying with family and recently moved back into the house and is also had several deaths in the family.    History reviewed. No pertinent past medical history.  There are no active problems to display for this patient.   History reviewed. No pertinent surgical history.      Home Medications    Prior to Admission medications   Medication Sig Start Date End Date Taking? Authorizing Provider  benzonatate (TESSALON) 200 MG capsule Take 1 capsule (200 mg total) by mouth 2 (two) times daily as needed for cough. 11/05/17   Eustace MooreNelson, Yvonne Sue, MD  hydrOXYzine (ATARAX/VISTARIL) 25 MG tablet Take 1 tablet (25 mg total) by mouth every 6 (six) hours. 12/14/17   Kashtyn Jankowski, Tinnie GensJeffrey, PA-C  ibuprofen (ADVIL,MOTRIN) 800 MG tablet Take 1 tablet (800 mg total) by mouth 3 (three) times daily. 11/05/17   Eustace MooreNelson, Yvonne Sue, MD    Family History No family history on file.  Social  History Social History   Tobacco Use  . Smoking status: Current Every Day Smoker  . Smokeless tobacco: Never Used  Substance Use Topics  . Alcohol use: Never    Frequency: Never  . Drug use: Yes    Types: Marijuana    Comment: last use 12/13/17     Allergies   Patient has no known allergies.   Review of Systems Review of Systems  All other systems reviewed and are negative.  Physical Exam Updated Vital Signs BP 128/88 (BP Location: Right Arm)   Pulse 75   Temp 97.7 F (36.5 C) (Oral)   Resp 18   Ht 5\' 7"  (1.702 m)   Wt 81.6 kg   SpO2 100%   BMI 28.19 kg/m   Physical Exam  Constitutional: He is oriented to person, place, and time. He appears well-developed and well-nourished.  HENT:  Head: Normocephalic and atraumatic.  Eyes: Pupils are equal, round, and reactive to light. Conjunctivae are normal. Right eye exhibits no discharge. Left eye exhibits no discharge. No scleral icterus.  Neck: Normal range of motion. No JVD present. No tracheal deviation present.  Pulmonary/Chest: Effort normal. No stridor.  Neurological: He is alert and oriented to person, place, and time. Coordination normal.  Psychiatric: He has a normal mood and affect. His behavior is normal. Judgment and thought content normal.  Nursing note and vitals reviewed.   ED Treatments / Results  Labs (all labs  ordered are listed, but only abnormal results are displayed) Labs Reviewed - No data to display  EKG None  Radiology No results found.  Procedures Procedures (including critical care time)  Medications Ordered in ED Medications - No data to display   Initial Impression / Assessment and Plan / ED Course  I have reviewed the triage vital signs and the nursing notes.  Pertinent labs & imaging results that were available during my care of the patient were reviewed by me and considered in my medical decision making (see chart for details).      Discharge Meds:  Vistaril  Assessment/Plan: 22 year old male presents today with anxiety and paranormal behavior.  Patient was seen yesterday and medically cleared.  Patient did not know what he was supposed to do when he left the hospital.  Patient has no significant changes to his baseline.  I do feel patient would benefit from outpatient behavioral health resources, I encouraged him to follow-up with [facility], use Vistaril as needed for anxiety.  He can return at any time for any new or worsening signs or symptoms.  Both the patient and his mother and father verbalized understanding and agreement to today's plan.   Final Clinical Impressions(s) / ED Diagnoses   Final diagnoses:  Anxiety  Paranoia [facility])    ED Discharge Orders         Ordered    hydrOXYzine (ATARAX/VISTARIL) 25 MG tablet  Every 6 hours     12/14/17 1043           Eyvonne Mechanic, PA-C 12/14/17 1048    Pricilla Loveless, MD 12/15/17 [phone]

## 2017-12-14 NOTE — ED Triage Notes (Signed)
Pt reports feeling paranoid for the last 2-3 months.  Pt states he afraid that people will break in. He does state however that he lives in a neighborhood where this is possible.  Pt reports visual and auditory hallucinations intermittently. Denies SI/HI.  Pt also reports anxiety.

## 2017-12-14 NOTE — Discharge Instructions (Addendum)
Please read attached information. If you experience any new or worsening signs or symptoms please return to the emergency room for evaluation. Please follow-up with your primary care provider or specialist as discussed. Please use medication prescribed only as directed and discontinue taking if you have any concerning signs or symptoms.   °

## 2017-12-15 ENCOUNTER — Other Ambulatory Visit: Payer: Self-pay

## 2017-12-15 ENCOUNTER — Encounter (HOSPITAL_COMMUNITY): Payer: Self-pay

## 2017-12-15 ENCOUNTER — Emergency Department (HOSPITAL_COMMUNITY)
Admission: EM | Admit: 2017-12-15 | Discharge: 2017-12-18 | Disposition: A | Discharge status: Home / Self Care | Payer: Medicaid Other | Attending: Emergency Medicine | Admitting: Emergency Medicine

## 2017-12-15 DIAGNOSIS — R440 Auditory hallucinations: Secondary | ICD-10-CM | POA: Insufficient documentation

## 2017-12-15 DIAGNOSIS — F22 Delusional disorders: Secondary | ICD-10-CM

## 2017-12-15 DIAGNOSIS — F17200 Nicotine dependence, unspecified, uncomplicated: Secondary | ICD-10-CM | POA: Insufficient documentation

## 2017-12-15 DIAGNOSIS — R441 Visual hallucinations: Secondary | ICD-10-CM | POA: Insufficient documentation

## 2017-12-15 DIAGNOSIS — F419 Anxiety disorder, unspecified: Secondary | ICD-10-CM

## 2017-12-15 DIAGNOSIS — Z046 Encounter for general psychiatric examination, requested by authority: Secondary | ICD-10-CM | POA: Insufficient documentation

## 2017-12-15 DIAGNOSIS — Z79899 Other long term (current) drug therapy: Secondary | ICD-10-CM | POA: Insufficient documentation

## 2017-12-15 DIAGNOSIS — F12150 Cannabis abuse with psychotic disorder with delusions: Secondary | ICD-10-CM | POA: Insufficient documentation

## 2017-12-15 DIAGNOSIS — F12159 Cannabis abuse with psychotic disorder, unspecified: Secondary | ICD-10-CM | POA: Diagnosis present

## 2017-12-15 DIAGNOSIS — F29 Unspecified psychosis not due to a substance or known physiological condition: Secondary | ICD-10-CM | POA: Diagnosis present

## 2017-12-15 DIAGNOSIS — F329 Major depressive disorder, single episode, unspecified: Secondary | ICD-10-CM | POA: Insufficient documentation

## 2017-12-15 DIAGNOSIS — F23 Brief psychotic disorder: Secondary | ICD-10-CM | POA: Insufficient documentation

## 2017-12-15 LAB — RAPID URINE DRUG SCREEN, HOSP PERFORMED
Amphetamines: NOT DETECTED
BENZODIAZEPINES: NOT DETECTED
Barbiturates: NOT DETECTED
COCAINE: NOT DETECTED
Opiates: NOT DETECTED
Tetrahydrocannabinol: POSITIVE — AB

## 2017-12-15 NOTE — ED Notes (Signed)
Patient/mother made aware of overnight observation on patient, and that patient will need to be changed
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
## 2017-12-15 NOTE — ED Notes (Signed)
Patient changed to scrubs, security paged for wanding

## 2017-12-15 NOTE — ED Triage Notes (Signed)
Pt here with mother. Mother reports continued anxiety and paranoia since his visit here a couple days ago. Pt given prescription for hydroxyzine with no relief of symptoms. Pt calm and cooperative in triage. Pt denies SI/HI but reports occasional AH/VH.
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
## 2017-12-15 NOTE — ED Notes (Signed)
Patient expressing increasing stress and increasing sensation of insecurity with staff and environment.  Mother and this RN reassured patient, and this RN encouraged patient to let me know what she could do to increase his feelings of safety.  Will speak to EDP about patient's concerns

## 2017-12-15 NOTE — ED Notes (Signed)
TTS at this time. 

## 2017-12-16 ENCOUNTER — Other Ambulatory Visit: Payer: Self-pay

## 2017-12-16 MED ORDER — HYDROXYZINE HCL 50 MG PO TABS
50.0000 mg | ORAL_TABLET | Freq: Four times a day (QID) | ORAL | Status: DC | PRN
  Administered 2017-12-16 – 2017-12-17 (×5): 50 mg via ORAL
  Filled 2017-12-16 (×5): qty 1

## 2017-12-16 MED ORDER — ZOLPIDEM TARTRATE 5 MG PO TABS
5.0000 mg | ORAL_TABLET | Freq: Every evening | ORAL | Status: DC | PRN
  Administered 2017-12-16 – 2017-12-17 (×3): 5 mg via ORAL
  Filled 2017-12-16 (×3): qty 1

## 2017-12-16 MED ORDER — LORAZEPAM 1 MG PO TABS
1.0000 mg | ORAL_TABLET | Freq: Once | ORAL | Status: AC
  Administered 2017-12-16: 1 mg via ORAL
  Filled 2017-12-16: qty 1

## 2017-12-16 NOTE — ED Notes (Addendum)
Mother noted to be sleeping at bedside.

## 2017-12-16 NOTE — [facility] Counselor (Addendum)
Per Reola Calkinsravis Money, NP patient meets in patient criteria. TTS to secure placement. [facility] is reviewing.

## 2017-12-16 NOTE — ED Notes (Signed)
Pt requesting med "to help me sleep" - states he is feeling anxious and "I'm thinking too much" - denies AH/VH at this time. [facility].

## 2017-12-16 NOTE — ED Notes (Signed)
Mother has arrived to visit w/pt.

## 2017-12-16 NOTE — Progress Notes (Signed)
Patient meets criteria for inpatient treatment. [facility] or available [facility] at [facility]. CSW faxed referrals to the following facilities for review:  [address], [facility], [address] [facility], [facility], [facility], [facility], [address], [address], [name], [facility], [facility], [facility], [facility], [facility], [facility], [address] [place], [place], [place], [place] ([place]), [place], [name], and [name].  TTS will continue to seek bed placement.  Vilma MeckelEarl R. Algis GreenhouseForbes, MSW, LCSW Clinical Social Work/Disposition Phone: [phone] Fax: [phone]

## 2017-12-16 NOTE — ED Notes (Signed)
Mother leaving at this time - advised she will return at 0830 visitation time in am.

## 2017-12-16 NOTE — ED Notes (Signed)
Pt noted to be wearing burgundy scrubs and doo-rag. Pt ambulated to bathroom and back to room w/o difficulty. Eating breakfast. Mother remains at bedside. Advised pt and mother if plan is for pt - Inpt - then will need to follow rules for psych pt's in ED - voiced understanding.

## 2017-12-16 NOTE — ED Notes (Signed)
Sitter has arrived to bedside.  

## 2017-12-16 NOTE — ED Notes (Signed)
Staffing Office aware of need for Sitter. 

## 2017-12-16 NOTE — ED Notes (Signed)
Patient ambulatory to bathroom with steady gait at this time 

## 2017-12-16 NOTE — ED Notes (Signed)
Breakfast tray ordered 

## 2017-12-16 NOTE — ED Notes (Signed)
Pt and mother lying on bed watching tv. Mother aware visitation time ends at 1800. Pt appears to be much calmer.

## 2017-12-16 NOTE — ED Notes (Signed)
Parents leaving at this time - advised will return at 1730 visitation time.

## 2017-12-16 NOTE — ED Notes (Addendum)
While collecting vitals on pt, pt's mother informed this EMT that the pt had not slept in 3 days. Pt and his mother requested medication to help the pt sleep since he had been awake for so long. Pt's mother also informed this EMT that when pt attempts to go to sleep, paranoia and voices are the worst. Raynelle FanningJulie, RN informed.

## 2017-12-16 NOTE — ED Notes (Addendum)
Mother and pt voiced understanding of tx plan - seeking Inpt Tx and voiced understanding of Medical Clearance Pt Policy form - copy given. Mother leaving at this time - took all of pt's belongings including pt's doo-rag per pt's request.

## 2017-12-17 NOTE — ED Notes (Signed)
Talking on phone with mother.  Noted to be anxious and tearful.  Spoke with Dr. Anitra LauthPlunkett.  Medicated per order for anxiety.

## 2017-12-17 NOTE — ED Notes (Signed)
Pt in shower.  

## 2017-12-17 NOTE — ED Notes (Signed)
Ordered diet tray for pt  

## 2017-12-17 NOTE — ED Notes (Signed)
Atarax given as requested - pt c/o paranoia and anxiety. Pt lying on bed watching tv - calm, cooperative.

## 2017-12-17 NOTE — ED Notes (Signed)
Pt given Atarax as requested d/t c/o feeling anxious.

## 2017-12-17 NOTE — Progress Notes (Signed)
CSW contacted hospitals regarding referrals with the following results:  Still reviewing: [name], [name], [name], [name], [name] (no bed but will review Monday 12/9), [name], [name] (no bed currently will review Monday 12/9), [name] (no bed currently), [name], [name] (full will review Monday 12/9), [name], [name], [name], and [name]  Denied: [facility], [name], [name], [name], [name], [name], [name], [name], and [name].  TTS will continue to seek placement.   Bryan MeckelEarl R. Algis GreenhouseForbes, MSW, LCSW Clinical Social Work/Disposition Phone: [phone] Fax: [phone]

## 2017-12-17 NOTE — ED Notes (Signed)
Re-TTS complete. 

## 2017-12-17 NOTE — ED Notes (Signed)
Pt's mother visited.

## 2017-12-17 NOTE — ED Notes (Signed)
Pt's mother visited w/pt - is now leaving. Advised she will return in am. Pt noted to be calm, cooperative. Watching tv.

## 2017-12-17 NOTE — ED Notes (Signed)
Pt continues w/paranoia - Sitter sitting at bedside as pt has requested.
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
## 2017-12-18 DIAGNOSIS — F12159 Cannabis abuse with psychotic disorder, unspecified: Secondary | ICD-10-CM

## 2017-12-18 DIAGNOSIS — F29 Unspecified psychosis not due to a substance or known physiological condition: Secondary | ICD-10-CM

## 2017-12-18 MED ORDER — TRAZODONE HCL 50 MG PO TABS
50.0000 mg | ORAL_TABLET | Freq: Every evening | ORAL | Status: DC | PRN
  Administered 2017-12-18: 50 mg via ORAL
  Filled 2017-12-18: qty 1

## 2017-12-18 MED ORDER — OLANZAPINE 10 MG PO TABS: 5.0000 mg | ORAL_TABLET | Freq: Two times a day (BID) | ORAL | 0 refills | Status: DC

## 2017-12-18 MED ORDER — OLANZAPINE 5 MG PO TABS
10.0000 mg | ORAL_TABLET | Freq: Two times a day (BID) | ORAL | Status: DC
  Administered 2017-12-18: 10 mg via ORAL

## 2017-12-18 MED ORDER — OLANZAPINE 5 MG PO TBDP
10.0000 mg | ORAL_TABLET | Freq: Once | ORAL | Status: AC
  Administered 2017-12-18: 10 mg via ORAL
  Filled 2017-12-18: qty 2

## 2017-12-18 NOTE — ED Provider Notes (Signed)
Notes, labs, vitals reviewed. Patient [facility] last [facility]  Today, he is refusing vitals.  Will monitor, and happy HH continue to give recommendations regarding sleep aides.  Pt pending placement for behavioral health.   Alveria ApleyCaccavale, Sahaj Bona, PA-C 12/18/17 [phone]    Jacalyn LefevreHaviland, Julie, MD 12/18/17 [phone]

## 2017-12-18 NOTE — Consult Note (Signed)
[facility] Psych ED Discharge  12/18/2017 4:29 PM Bryan Kirk  MRN:  [id number] Principal Problem: Cannabis abuse with psychotic disorder [facility]) Discharge Diagnoses: Active Problems:   Psychosis [facility])   Subjective: 22 yo male who presented to the ED with paranoia after using cannabis.  He continues to use despite having paranoia.  Medications started and he stabilized. Encouraged him to stop use and use the medications.  No suicidal/homicidal ideations, hallucinations, or withdrawal symptoms.  He is stable for discharge to follow up outpatient, Rx provided, stable for discharge.  Total Time spent with patient: 30 minutes  Past Psychiatric History: cannabis use with psychosis  Past Medical History: History reviewed. No pertinent past medical history. History reviewed. No pertinent surgical history. Family History: No family history on file. Family Psychiatric  History: none Social History:  Social History   Substance and Sexual Activity  Alcohol Use Never  . Frequency: Never    Social History   Substance and Sexual Activity  Drug Use Yes  . Types: Marijuana   Comment: last use 12/13/17   Social History   Socioeconomic History  . Marital status: Single    Spouse name: Not on file  . Number of children: Not on file  . Years of education: Not on file  . Highest education level: Not on file  Occupational History  . Not on file  Social Needs  . Financial resource strain: Not on file  . Food insecurity:    Worry: Not on file    Inability: Not on file  . Transportation needs:    Medical: Not on file    Non-medical: Not on file  Tobacco Use  . Smoking status: Current Every Day Smoker  . Smokeless tobacco: Never Used  Substance and Sexual Activity  . Alcohol use: Never    Frequency: Never  . Drug use: Yes    Types: Marijuana    Comment: last use 12/13/17  . Sexual activity: Not Currently  Lifestyle  . Physical activity:    Days per week: Not on file    Minutes per  session: Not on file  . Stress: Not on file  Relationships  . Social connections:    Talks on phone: Not on file    Gets together: Not on file    Attends religious service: Not on file    Active member of club or organization: Not on file    Attends meetings of clubs or organizations: Not on file    Relationship status: Not on file  Other Topics Concern  . Not on file  Social History Narrative  . Not on file    Has this patient used any form of tobacco in the last 30 days? (Cigarettes, Smokeless Tobacco, Cigars, and/or Pipes) A prescription for an FDA-approved tobacco cessation medication was offered at discharge and the patient refused  Current Medications: Current Facility-Administered Medications  Medication Dose Route Frequency Provider Last Rate Last Dose  . hydrOXYzine (ATARAX/VISTARIL) tablet 50 mg  50 mg Oral Q6H PRN Money, Gerlene Burdock, FNP   50 mg at 12/17/17 2235  . OLANZapine (ZYPREXA) tablet 10 mg  10 mg Oral BID Charm Rings, NP   10 mg at 12/18/17 1100  . traZODone (DESYREL) tablet 50 mg  50 mg Oral QHS PRN Nira Conn A, NP   50 mg at 12/18/17 4098   Current Outpatient Medications  Medication Sig Dispense Refill  . hydrOXYzine (ATARAX/VISTARIL) 25 MG tablet Take 1 tablet (25 mg total) by  mouth every 6 (six) hours. 12 tablet 0  . benzonatate (TESSALON) 200 MG capsule Take 1 capsule (200 mg total) by mouth 2 (two) times daily as needed for cough. (Patient not taking: Reported on 12/15/2017) 20 capsule 0  . ibuprofen (ADVIL,MOTRIN) 800 MG tablet Take 1 tablet (800 mg total) by mouth 3 (three) times daily. (Patient not taking: Reported on 12/15/2017) 21 tablet 0   PTA Medications:  (Not in a hospital admission)  Musculoskeletal: Strength & Muscle Tone: within normal limits Gait & Station: normal Patient leans: N/A  Psychiatric Specialty Exam: Physical Exam  Nursing note and vitals reviewed. Constitutional: He is oriented to person, place, and time. He appears  well-developed and well-nourished.  HENT:  Head: Normocephalic.  Neck: Normal range of motion.  Respiratory: Effort normal.  Musculoskeletal: Normal range of motion.  Neurological: He is alert and oriented to person, place, and time.  Psychiatric: His speech is normal and behavior is normal. Judgment and thought content normal. His mood appears anxious. His affect is blunt. Cognition and memory are normal.    Review of Systems  Psychiatric/Behavioral: The patient is nervous/anxious.   All other systems reviewed and are negative.   Blood pressure (!) 125/47, pulse 82, temperature 98.3 F (36.8 C), temperature source Oral, resp. rate 17, height 5\' 7"  (1.702 m), weight 81.6 kg, SpO2 100 %.Body mass index is 28.18 kg/m.  General Appearance: Casual  Eye Contact:  Good  Speech:  Normal Rate  Volume:  Normal  Mood:  Anxious, mild  Affect:  Blunt  Thought Process:  Coherent and Descriptions of Associations: Intact  Orientation:  Full (Time, Place, and Person)  Thought Content:  WDL and Logical  Suicidal Thoughts:  No  Homicidal Thoughts:  No  Memory:  Immediate;   Good Recent;   Good Remote;   Good  Judgement:  Fair  Insight:  Fair  Psychomotor Activity:  Normal  Concentration:  Concentration: Good and Attention Span: Good  Recall:  Good  Fund of Knowledge:  Fair  Language:  Good  Akathisia:  No  Handed:  Right  AIMS (if indicated):     Assets:  Housing Leisure Time Physical Health Resilience Social Support  ADL's:  Intact  Cognition:  WNL  Sleep:        Demographic Factors:  Male and Adolescent or young adult  Loss Factors: NA  Historical Factors: NA  Risk Reduction Factors:   Sense of responsibility to family, Living with another person, especially a relative and Positive social support  Continued Clinical Symptoms:  Anxiety, mild  Cognitive Features That Contribute To Risk:  None    Suicide Risk:  Minimal: No identifiable suicidal ideation.  Patients  presenting with no risk factors but with morbid ruminations; may be classified as minimal risk based on the severity of the depressive symptoms    Plan Of Care/Follow-up recommendations:  Cannabis abuse with psychosis with paranoia -Started Zyprexa 5 mg BID for psychosis  Activity:  as tolerated Diet:  heart healthy diet  Disposition: discharge home Nanine MeansLORD, Arek Spadafore, NP 12/18/2017, 4:29 PM

## 2017-12-18 NOTE — ED Notes (Signed)
Pt is still wide awake patient states he is unable to sleep; RN advised pt that anxiety med can only be given q 6 hours, pt also had sleep medication-Monique,RN

## 2017-12-18 NOTE — Consult Note (Signed)
                         Tele Psych Assessment  Patient seen and chart is reviewed. Patient continues to meet criteria for inpatient psychiatric hospitalization. Patient states "I feel very paranoid. Sometimes I get homicidal thoughts because I fear someone is out to get me. I had to get some medication last night. I'm also still hearing voices telling me to get out of here. I don't feel ready to go home because I am scared." Recommend Zyprexa Zydis 10 mg 1 po hs scheduled to help with symptoms of psychosis and agitation. Case discussed with Dr. Lucianne MussKumar.   Bryan KaufmannLaura Yaslene Lindamood, NP 12/18/2017 11:45 am

## 2017-12-18 NOTE — ED Notes (Signed)
Pt refused morning vitals; pt has not fallen asleep since receiving last sleep aid-Monique,RN

## 2017-12-18 NOTE — ED Notes (Signed)
Declined W/C at D/C and was escorted to lobby by RN. 

## 2017-12-18 NOTE — Discharge Instructions (Signed)
Cannabis Use Disorder Cannabis use disorder is when using marijuana disrupts a person's daily life or causes health problems. This condition can be dangerous. The health problems this condition can cause include:  Long-lasting problems with thinking and learning. These can be permanent in young people.  Severe anxiety.  Paranoia.  Hallucinations.  Dangerously high blood pressure and heart rate.  Schizophrenia.  Breathing problems.  Problems with child development during and after pregnancy.  People with this condition are also more likely to use other drugs. What are the causes? This condition is caused by using marijuana too much over time. It is not caused by using it only once in a while. Many people with this condition use marijuana because it gives them a feeling of extreme pleasure or relaxation. What increases the risk? This condition is more likely to develop in:  Men.  People with a family history of cannabis use disorder.  People with mental health issues such as depression or post-traumatic stress disorder.  What are the signs or symptoms? Symptoms of this condition include:  Using greater amounts of marijuana than you want to, or using marijuana for longer than you want to.  Craving marijuana.  Spending a lot of time getting marijuana and using it or recovering from its effects.  Having problems at work, at school, at home, or in relationships because of marijuana use.  Giving up or cutting down on important life activities because of marijuana use.  Using marijuana at times when it is dangerous, such as while you are driving a car.  Needing more and more marijuana to get the same effect you want from the marijuana (building up a tolerance).  Physical problems, such as: ? A long-lasting cough. ? Bronchitis. ? Emphysema. ? Throat and lung cancer.  Mental problems, such as: ? Psychosis. ? Anxiety. ? Trouble sleeping.  Having symptoms of withdrawal  when you stop using marijuana. Symptom of withdrawal include: ? Irritability or anger. ? Anxiety or restlessness. ? Trouble sleeping. ? Loss of appetite or weight loss. ? Aches and pains. ? Shakiness, sweating, or chills.  How is this diagnosed? This condition is diagnosed with an assessment. During the assessment, your health care provider will ask about your marijuana use and about how it affects your life. You will be diagnosed with the condition if you have had at least two symptoms of this condition within a 12-month period. How severe the condition is depends on how many symptoms you have.  If you have two to three symptoms, your condition is mild.  If you have four to five symptoms, your condition is moderate.  If you have six or more symptoms, your condition is severe.  Your health care provider may perform a physical exam or do lab tests to see if you have physical problems resulting from marijuana use. Your health care provider may also screen for drug use and refer you to a mental health professional for evaluation. How is this treated? Treatment for this condition is usually provided by mental health professionals with training in substance use disorders. Your treatment may involve:  Counseling. This treatment is also called talk therapy. It is provided by substance use treatment counselors. A counselor can address the reasons you use marijuana and suggest ways to keep you from using it again. The goals of talk therapy are to: ? Find healthy activities to replace using marijuana. ? Identify and avoid the things that trigger your marijuana use. ? Help you learn how to handle   cravings.  Support groups. Support groups are led by people who have quit using marijuana. They provide emotional support, advice, and guidance.  Medicine. Medicine is used to treat mental health issues that trigger marijuana use or that result from it.  Follow these instructions at home:  Take  over-the-counter and prescription medicines only as told by your health care provider.  Check with your health care provider before starting any new medicines.  Keep all follow-up visits as told by your health care provider. This is important.  Work with your counselor or group to develop tools to keep you from using marijuana again (relapsing).  Make healthy lifestyle choices, such as: ? Eating a healthy diet. ? Getting enough exercise. ? Improving your stress-management skills.  Learn daily living skills and work skills. Where to find more information:  National Institute on Drug Abuse: www.drugabuse.gov  Substance Abuse and Mental Health Services Administration: www.samhsa.gov Contact a health care provider if:  You are not able to take your medicines as told.  Your symptoms get worse. Get help right away if:  You have serious thoughts about hurting yourself or others. If you ever feel like you may hurt yourself or others, or have thoughts about taking your own life, get help right away. You can go to your nearest emergency department or call:  Your local emergency services (911 in the [place]  A suicide crisis helpline, such as the National Suicide Prevention Lifeline at 1-800-273-8255. This is open 24 hours a day.  This information is not intended to replace advice given to you by your health care provider. Make sure you discuss any questions you have with your health care provider. Document Released: 12/25/1999 Document Revised: 09/25/2015 Document Reviewed: 09/25/2015 Elsevier Interactive Patient Education  2018 [employer]  

## 2017-12-18 NOTE — ED Triage Notes (Signed)
TTS done 11:41

## 2017-12-18 NOTE — ED Notes (Addendum)
Patient gets up pacing the floor going back and forth to the bathroom then patient comes to RN station and picks up phone to call his family; RN Staff advised patient that he is not to use the phone at this time of morning and other patients are trying to sleep; pt continues to use the phone and ignore staff; Security called and patient cussed security out and told him to get out his room; ED[facility] called for medication but [facility]; RN [facility] with Barbara CowerJason, [facility]-Monique,RN

## 2018-02-07 ENCOUNTER — Encounter (HOSPITAL_COMMUNITY): Payer: Self-pay

## 2018-02-07 ENCOUNTER — Ambulatory Visit (HOSPITAL_COMMUNITY)
Admission: EM | Admit: 2018-02-07 | Discharge: 2018-02-07 | Disposition: A | Discharge status: Home / Self Care | Payer: Medicaid Other | Attending: Family Medicine | Admitting: Family Medicine

## 2018-02-07 DIAGNOSIS — R05 Cough: Secondary | ICD-10-CM

## 2018-02-07 DIAGNOSIS — R51 Headache: Secondary | ICD-10-CM

## 2018-02-07 DIAGNOSIS — R0789 Other chest pain: Secondary | ICD-10-CM

## 2018-02-07 DIAGNOSIS — J069 Acute upper respiratory infection, unspecified: Secondary | ICD-10-CM | POA: Insufficient documentation

## 2018-02-07 DIAGNOSIS — B9789 Other viral agents as the cause of diseases classified elsewhere: Secondary | ICD-10-CM | POA: Insufficient documentation

## 2018-02-07 DIAGNOSIS — H6123 Impacted cerumen, bilateral: Secondary | ICD-10-CM

## 2018-02-07 LAB — POCT RAPID STREP A: Streptococcus, Group A Screen (Direct): NEGATIVE

## 2018-02-07 MED ORDER — CETIRIZINE HCL 10 MG PO CAPS: 10.0000 mg | ORAL_CAPSULE | Freq: Every day | ORAL | 0 refills | Status: DC

## 2018-02-07 MED ORDER — PSEUDOEPH-BROMPHEN-DM 30-2-10 MG/5ML PO SYRP: 5.0000 mL | ORAL_SOLUTION | Freq: Four times a day (QID) | ORAL | 0 refills | Status: DC | PRN

## 2018-02-07 MED ORDER — IBUPROFEN 600 MG PO TABS: 600.0000 mg | ORAL_TABLET | Freq: Four times a day (QID) | ORAL | 0 refills | Status: DC | PRN

## 2018-02-07 NOTE — Discharge Instructions (Signed)
You likely having a viral upper respiratory infection. We recommended symptom control. I expect your symptoms to start improving in the next 1-2 weeks.  ° °1. Take a daily allergy pill/anti-histamine like Zyrtec, Claritin, or Store brand consistently for 2 weeks ° °2. For congestion you may try an oral decongestant like Mucinex or sudafed. You may also try intranasal flonase nasal spray or saline irrigations (neti pot, sinus cleanse) ° °3. For your sore throat you may try cepacol lozenges, salt water gargles, throat spray. Treatment of congestion may also help your sore throat. ° °4. For cough you may try Robitussen, Mucinex DM ° °5. Take Tylenol or Ibuprofen to help with pain/inflammation ° °6. Stay hydrated, drink plenty of fluids to keep throat coated and less irritated ° °Honey Tea °For cough/sore throat try using a honey-based tea. Use 3 teaspoons of honey with juice squeezed from half lemon. Place shaved pieces of ginger into 1/2-1 cup of water and warm over stove top. Then mix the ingredients and repeat every 4 hours as needed. °

## 2018-02-07 NOTE — ED Provider Notes (Signed)
[facility]    CSN: [id number] Arrival date & time: 02/07/18  1351     History   Chief Complaint Chief Complaint  Patient presents with  . Chest Pain  . Headache  . Ear Fullness    HPI Letcher Antonio Linna CapriceLen Cubero is a 23 y.o. male no contributing past medical history presenting today for evaluation of URI symptoms.  Patient states that he has had cough and congestion, this is been associated with central chest discomfort, chest congestion and tightness.  He has also had headache, ear fullness and a few episodes of diarrhea.  Denies any nausea or vomiting.  Symptoms have been going on for 1 to 2 days.  Denies any fevers.  He has not taken any medicines for symptoms.  Denies shortness of breath.  HPI  History reviewed. No pertinent past medical history.  Patient Active Problem List   Diagnosis Date Noted  . Psychosis (HCC) 12/18/2017  . 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 No pertinent surgical history.     Home Medications    Prior to Admission medications   Medication Sig Start Date End Date Taking? Authorizing Provider  brompheniramine-pseudoephedrine-DM 30-2-10 MG/5ML syrup Take 5 mLs by mouth 4 (four) times daily as needed. 02/07/18   ,  C, PA-C  Cetirizine HCl 10 MG CAPS Take 1 capsule (10 mg total) by mouth daily for 10 days. 02/07/18 02/17/18  ,  C, PA-C  hydrOXYzine (ATARAX/VISTARIL) 25 MG tablet Take 1 tablet (25 mg total) by mouth every 6 (six) hours. 12/14/17   Hedges, Tinnie GensJeffrey, PA-C  ibuprofen (ADVIL,MOTRIN) 600 MG tablet Take 1 tablet (600 mg total) by mouth every 6 (six) hours as needed. 02/07/18   ,  C, PA-C  OLANZapine (ZYPREXA) 10 MG tablet Take 0.5 tablets (5 mg total) by mouth 2 (two) times daily. 12/18/17   Charm RingsLord, Jamison Y, NP    Family History Family History  Problem Relation Age of Onset  . Healthy Mother   . Healthy Father     Social History Social  History   Tobacco Use  . Smoking status: Current Every Day Smoker  . Smokeless tobacco: Never Used  Substance Use Topics  . Alcohol use: Never    Frequency: Never  . Drug use: Yes    Types: Marijuana    Comment: last use 12/13/17     Allergies   Patient has no known allergies.   Review of Systems Review of Systems  Constitutional: Negative for activity change, appetite change, chills, fatigue and fever.  HENT: Positive for congestion, ear pain and rhinorrhea. Negative for sinus pressure, sore throat and trouble swallowing.   Eyes: Negative for discharge and redness.  Respiratory: Positive for cough and chest tightness. Negative for shortness of breath.   Cardiovascular: Negative for chest pain.  Gastrointestinal: Positive for diarrhea. Negative for abdominal pain, nausea and vomiting.  Musculoskeletal: Negative for myalgias.  Skin: Negative for rash.  Neurological: Positive for headaches. Negative for dizziness and light-headedness.     Physical Exam Triage Vital Signs ED Triage Vitals  Enc Vitals Group     BP 02/07/18 1431 (!) 124/105     Pulse Rate 02/07/18 1431 70     Resp 02/07/18 1431 18     Temp 02/07/18 1431 98 F (36.7 C)     Temp Source 02/07/18 1431 Oral     SpO2 02/07/18 1431 100 %     Weight --  Height --      Head Circumference --      Peak Flow --      Pain Score 02/07/18 1436 7     Pain Loc --      Pain Edu? --      Excl. in GC? --    No data found.  Updated Vital Signs BP (!) 124/105 (BP Location: Right Arm)   Pulse 70   Temp 98 F (36.7 C) (Oral)   Resp 18   SpO2 100%   Visual Acuity Right Eye Distance:   Left Eye Distance:   Bilateral Distance:    Right Eye Near:   Left Eye Near:    Bilateral Near:     Physical Exam Vitals signs and nursing note reviewed.  Constitutional:      Appearance: He is well-developed.  HENT:     Head: Normocephalic and atraumatic.     Ears:     Comments: Bilateral cerumen impaction, after  removal, canals nonerythematous, bilateral TMs without erythema, good cone light    Nose:     Comments: Mildly erythematous, small amount of rhinorrhea bilaterally    Mouth/Throat:     Comments: Oral mucosa pink and moist, no tonsillar enlargement or exudate. Posterior pharynx patent and nonerythematous, no uvula deviation or swelling. Normal phonation. Eyes:     Conjunctiva/sclera: Conjunctivae normal.  Neck:     Musculoskeletal: Neck supple.  Cardiovascular:     Rate and Rhythm: Normal rate and regular rhythm.     Heart sounds: No murmur.  Pulmonary:     Effort: Pulmonary effort is normal. No respiratory distress.     Breath sounds: Normal breath sounds.     Comments: Breathing comfortably at rest, CTABL, no wheezing, rales or other adventitious sounds auscultated  Mild anterior chest tenderness to palpation Abdominal:     Palpations: Abdomen is soft.     Tenderness: There is no abdominal tenderness.  Skin:    General: Skin is warm and dry.  Neurological:     Mental Status: He is alert.      UC Treatments / Results  Labs (all labs ordered are listed, but only abnormal results are displayed) Labs Reviewed  CULTURE, GROUP A STREP [facility])  POCT RAPID STREP A    EKG None  Radiology No results found.  Procedures Ear Cerumen Removal Date/Time: 02/07/2018 2:40 PM Performed by: , Junius Creamer, PA-C Authorized by: Mardella Layman, MD   Consent:    Consent obtained:  Verbal   Consent given by:  Patient   Risks discussed:  Incomplete removal, infection and pain Procedure details:    Location:  L ear and R ear   Procedure type: curette   Post-procedure details:    Inspection:  TM intact   Hearing quality:  Normal   Patient tolerance of procedure:  Tolerated well, no immediate complications   (including critical care time)  Medications Ordered in UC Medications - No data to display  Initial Impression / Assessment and Plan / UC Course  I have reviewed the triage  vital signs and the nursing notes.  Pertinent labs & imaging results that were available during my care of the patient were reviewed by me and considered in my medical decision making (see chart for details).     Bilateral cerumen removal, URI symptoms x1 to 2 days with some chest discomfort, lungs clear, likely viral etiology.  Will recommend symptomatic and supportive care.  Most likely more musculoskeletal cause of chest discomfort  versus underlying pulmonary pathology.  Continue to monitor,Discussed strict return precautions. Patient verbalized understanding and is agreeable with plan.  Final Clinical Impressions(s) / UC Diagnoses   Final diagnoses:  Viral URI with cough     Discharge Instructions     You likely having a viral upper respiratory infection. We recommended symptom control. I expect your symptoms to start improving in the next 1-2 weeks.   1. Take a daily allergy pill/anti-histamine like Zyrtec, Claritin, or Store brand consistently for 2 weeks  2. For congestion you may try an oral decongestant like Mucinex or sudafed. You may also try intranasal flonase nasal spray or saline irrigations (neti pot, sinus cleanse)  3. For your sore throat you may try cepacol lozenges, salt water gargles, throat spray. Treatment of congestion may also help your sore throat.  4. For cough you may try Robitussen, Mucinex DM  5. Take Tylenol or Ibuprofen to help with pain/inflammation  6. Stay hydrated, drink plenty of fluids to keep throat coated and less irritated  Honey Tea For cough/sore throat try using a honey-based tea. Use 3 teaspoons of honey with juice squeezed from half lemon. Place shaved pieces of ginger into 1/2-1 cup of water and warm over stove top. Then mix the ingredients and repeat every 4 hours as needed.    ED Prescriptions    Medication Sig Dispense Auth. Provider   brompheniramine-pseudoephedrine-DM 30-2-10 MG/5ML syrup Take 5 mLs by mouth 4 (four) times daily  as needed. 120 mL ,  C, PA-C   Cetirizine HCl 10 MG CAPS Take 1 capsule (10 mg total) by mouth daily for 10 days. 10 capsule ,  C, PA-C   ibuprofen (ADVIL,MOTRIN) 600 MG tablet Take 1 tablet (600 mg total) by mouth every 6 (six) hours as needed. 30 tablet , Turner C, PA-C     Controlled Substance Prescriptions [place] Controlled Substance Registry consulted? Not Applicable   Lew Dawes, [place] 02/08/18 1042

## 2018-02-07 NOTE — ED Triage Notes (Signed)
Pt presents with central chest pain from unknown source, headache, and ear fullness with both ears X 2 days.

## 2018-02-09 ENCOUNTER — Telehealth (HOSPITAL_COMMUNITY): Payer: Self-pay | Admitting: Emergency Medicine

## 2018-02-09 NOTE — Telephone Encounter (Signed)
Culture is positive for non group A Strep germ.  This is a finding of uncertain significance; not the typical 'strep throat' germ.  Attempted to reach patient. No answer at this time.  

## 2018-02-10 LAB — CULTURE, GROUP A STREP (THRC)

## 2018-02-12 ENCOUNTER — Telehealth (HOSPITAL_COMMUNITY): Payer: Self-pay | Admitting: Emergency Medicine

## 2018-02-12 NOTE — Telephone Encounter (Signed)
Attempted call x 2 no answer and LVMM to return call if not improved

## 2018-03-10 ENCOUNTER — Emergency Department (HOSPITAL_COMMUNITY)
Admission: EM | Admit: 2018-03-10 | Discharge: 2018-03-10 | Disposition: A | Discharge status: Other Acute Inpatient Hospital | Payer: Medicaid Other | Attending: Emergency Medicine | Admitting: Emergency Medicine

## 2018-03-10 ENCOUNTER — Emergency Department (HOSPITAL_COMMUNITY)
Admission: EM | Admit: 2018-03-10 | Discharge: 2018-03-10 | Disposition: A | Discharge status: Home / Self Care | Payer: Medicaid Other | Attending: Emergency Medicine | Admitting: Emergency Medicine

## 2018-03-10 ENCOUNTER — Inpatient Hospital Stay (HOSPITAL_COMMUNITY)
Admission: AD | Admit: 2018-03-10 | Discharge: 2018-03-13 | DRG: 885 | Disposition: A | Discharge status: Home / Self Care | Payer: Federal, State, Local not specified - Other | Source: Intra-hospital | Attending: Psychiatry | Admitting: Psychiatry

## 2018-03-10 ENCOUNTER — Encounter (HOSPITAL_COMMUNITY): Payer: Self-pay | Admitting: *Deleted

## 2018-03-10 ENCOUNTER — Encounter (HOSPITAL_COMMUNITY): Payer: Self-pay | Admitting: Emergency Medicine

## 2018-03-10 ENCOUNTER — Other Ambulatory Visit: Payer: Self-pay | Admitting: Family

## 2018-03-10 ENCOUNTER — Other Ambulatory Visit: Payer: Self-pay

## 2018-03-10 DIAGNOSIS — F2081 Schizophreniform disorder: Secondary | ICD-10-CM

## 2018-03-10 DIAGNOSIS — F17200 Nicotine dependence, unspecified, uncomplicated: Secondary | ICD-10-CM | POA: Insufficient documentation

## 2018-03-10 DIAGNOSIS — R45851 Suicidal ideations: Secondary | ICD-10-CM | POA: Diagnosis present

## 2018-03-10 DIAGNOSIS — F1220 Cannabis dependence, uncomplicated: Secondary | ICD-10-CM | POA: Diagnosis present

## 2018-03-10 DIAGNOSIS — F333 Major depressive disorder, recurrent, severe with psychotic symptoms: Secondary | ICD-10-CM | POA: Insufficient documentation

## 2018-03-10 DIAGNOSIS — F329 Major depressive disorder, single episode, unspecified: Secondary | ICD-10-CM

## 2018-03-10 DIAGNOSIS — R51 Headache: Secondary | ICD-10-CM | POA: Insufficient documentation

## 2018-03-10 DIAGNOSIS — Z5321 Procedure and treatment not carried out due to patient leaving prior to being seen by health care provider: Secondary | ICD-10-CM | POA: Insufficient documentation

## 2018-03-10 DIAGNOSIS — Z560 Unemployment, unspecified: Secondary | ICD-10-CM | POA: Diagnosis not present

## 2018-03-10 DIAGNOSIS — Z62810 Personal history of physical and sexual abuse in childhood: Secondary | ICD-10-CM | POA: Diagnosis present

## 2018-03-10 DIAGNOSIS — F1210 Cannabis abuse, uncomplicated: Secondary | ICD-10-CM

## 2018-03-10 DIAGNOSIS — Z79899 Other long term (current) drug therapy: Secondary | ICD-10-CM | POA: Diagnosis not present

## 2018-03-10 DIAGNOSIS — F32A Depression, unspecified: Secondary | ICD-10-CM

## 2018-03-10 LAB — CBC WITH DIFFERENTIAL/PLATELET
Abs Immature Granulocytes: 0.01 10*3/uL (ref 0.00–0.07)
Basophils Absolute: 0 10*3/uL (ref 0.0–0.1)
Basophils Relative: 0 %
Eosinophils Absolute: 0.1 10*3/uL (ref 0.0–0.5)
Eosinophils Relative: 1 %
HCT: 48.1 % (ref 39.0–52.0)
Hemoglobin: 16 g/dL (ref 13.0–17.0)
Immature Granulocytes: 0 %
LYMPHS ABS: 1.8 10*3/uL (ref 0.7–4.0)
Lymphocytes Relative: 25 %
MCH: 29.2 pg (ref 26.0–34.0)
MCHC: 33.3 g/dL (ref 30.0–36.0)
MCV: 87.8 fL (ref 80.0–100.0)
Monocytes Absolute: 0.4 10*3/uL (ref 0.1–1.0)
Monocytes Relative: 6 %
NRBC: 0 % (ref 0.0–0.2)
Neutro Abs: 4.9 10*3/uL (ref 1.7–7.7)
Neutrophils Relative %: 68 %
Platelets: 293 10*3/uL (ref 150–400)
RBC: 5.48 MIL/uL (ref 4.22–5.81)
RDW: 12.8 % (ref 11.5–15.5)
WBC: 7.2 10*3/uL (ref 4.0–10.5)

## 2018-03-10 LAB — COMPREHENSIVE METABOLIC PANEL
ALT: 27 U/L (ref 0–44)
AST: 19 U/L (ref 15–41)
Albumin: 4.4 g/dL (ref 3.5–5.0)
Alkaline Phosphatase: 60 U/L (ref 38–126)
Anion gap: 10 (ref 5–15)
BUN: 11 mg/dL (ref 6–20)
CO2: 20 mmol/L — ABNORMAL LOW (ref 22–32)
Calcium: 9.6 mg/dL (ref 8.9–10.3)
Chloride: 108 mmol/L (ref 98–111)
Creatinine, Ser: 0.92 mg/dL (ref 0.61–1.24)
GFR calc Af Amer: >60 mL/min (ref >60)
GFR calc non Af Amer: >60 mL/min (ref >60)
Glucose, Bld: 104 mg/dL — ABNORMAL HIGH (ref 70–99)
Potassium: 4.6 mmol/L (ref 3.5–5.1)
Sodium: 138 mmol/L (ref 135–145)
Total Bilirubin: 0.6 mg/dL (ref 0.3–1.2)
Total Protein: 7.8 g/dL (ref 6.5–8.1)

## 2018-03-10 LAB — RAPID URINE DRUG SCREEN, HOSP PERFORMED
Amphetamines: NOT DETECTED
Barbiturates: NOT DETECTED
Benzodiazepines: NOT DETECTED
Cocaine: NOT DETECTED
Opiates: NOT DETECTED
TETRAHYDROCANNABINOL: POSITIVE — AB

## 2018-03-10 LAB — TSH: TSH: 0.751 u[IU]/mL (ref 0.350–4.500)

## 2018-03-10 LAB — ETHANOL: Alcohol, Ethyl (B): <10 mg/dL (ref <10)

## 2018-03-10 MED ORDER — MAGNESIUM HYDROXIDE 400 MG/5ML PO SUSP: 30.0000 mL | Freq: Every day | ORAL | Status: DC | PRN

## 2018-03-10 MED ORDER — OLANZAPINE 10 MG PO TABS
10.0000 mg | ORAL_TABLET | Freq: Every day | ORAL | Status: DC
  Administered 2018-03-10: 10 mg via ORAL
  Filled 2018-03-10 (×3): qty 1

## 2018-03-10 MED ORDER — TEMAZEPAM 15 MG PO CAPS
30.0000 mg | ORAL_CAPSULE | Freq: Every evening | ORAL | Status: DC | PRN
  Administered 2018-03-10 – 2018-03-11 (×2): 30 mg via ORAL
  Filled 2018-03-10 (×2): qty 2

## 2018-03-10 MED ORDER — ALUM & MAG HYDROXIDE-SIMETH 200-200-20 MG/5ML PO SUSP: 30.0000 mL | ORAL | Status: DC | PRN

## 2018-03-10 MED ORDER — ACETAMINOPHEN 325 MG PO TABS
650.0000 mg | ORAL_TABLET | Freq: Four times a day (QID) | ORAL | Status: DC | PRN
  Administered 2018-03-12: 650 mg via ORAL
  Filled 2018-03-10: qty 2

## 2018-03-10 NOTE — ED Provider Notes (Signed)
[facility] EMERGENCY DEPARTMENT Provider Note   CSN: [id number] Arrival date & time: 03/10/18  1121    History   Chief Complaint Chief Complaint  Patient presents with  . Medical Clearance    HPI Bryan Kirk is a 23 y.o. male who presents with his mother for depression and anxiety.  Past medical history significant for history of depression, marijuana use.  His mother assists with history.  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  He was seen in the ED in December for the same problem.  At that time he was having paranoid thoughts and thought his coworkers were talking about him.  He was started on Seroquel but his mom notes that he has been taking this inconsistently because it is too sedating.  Today he was extremely anxious and was shaking and restless and he could not be calm down so his mom decided to bring him to the emergency department.  She states that he will have good and bad days.  He is seeing a therapist.  He is still unemployed.  He endorses continued marijuana use but no other drug or alcohol use.  He denies SI or self injury.  Mom states that she is not concerned that he will hurt anyone else.  He has been having some intermittent chest pain.  He denies any today.  He had a recent URI and was seen in urgent care.  This is improving.     HPI  History reviewed. No pertinent past medical history.  Patient Active Problem List   Diagnosis Date Noted  . Psychosis (HCC) 12/18/2017  . 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 No pertinent surgical history.      Home Medications    Prior to Admission medications   Medication Sig Start Date End Date Taking? Authorizing Provider  brompheniramine-pseudoephedrine-DM 30-2-10 MG/5ML  syrup Take 5 mLs by mouth 4 (four) times daily as needed. 02/07/18   Wieters, Hallie C, PA-C  Cetirizine HCl 10 MG CAPS Take 1 capsule (10 mg total) by mouth daily for 10 days. 02/07/18 02/17/18  Wieters, Hallie C, PA-C  hydrOXYzine (ATARAX/VISTARIL) 25 MG tablet Take 1 tablet (25 mg total) by mouth every 6 (six) hours. 12/14/17   Hedges, Tinnie Gens, PA-C  ibuprofen (ADVIL,MOTRIN) 600 MG tablet Take 1 tablet (600 mg total) by mouth every 6 (six) hours as needed. 02/07/18   Wieters, Hallie C, PA-C  OLANZapine (ZYPREXA) 10 MG tablet Take 0.5 tablets (5 mg total) by mouth 2 (two) times daily. 12/18/17   Charm Rings, NP    Family History Family History  Problem Relation Age of Onset  . Healthy Mother   . Healthy Father     Social History Social History   Tobacco Use  . Smoking status: Current Every Day Smoker  . Smokeless tobacco: Never Used  Substance Use Topics  . Alcohol use: Never    Frequency: Never  . Drug use: Yes    Types: Marijuana    Comment: last use 12/13/17     Allergies   Patient has no known allergies.   Review of Systems Review of Systems  Constitutional: Negative for fever.  HENT: Negative for sore throat.   Respiratory: Negative for shortness of breath.  Cardiovascular: Negative for chest pain.  Gastrointestinal: Negative for abdominal pain.  All other systems reviewed and are negative.    Physical Exam Updated Vital Signs BP 129/75 (BP Location: Right Arm)   Pulse 99   Temp 98.3 F (36.8 C) (Oral)   Ht 5\' 6"  (1.676 m)   Wt 83.9 kg   SpO2 99%   BMI 29.85 kg/m   Physical Exam Vitals signs and nursing note reviewed.  Constitutional:      General: He is not in acute distress.    Appearance: Normal appearance. He is well-developed.     Comments: Cooperative.  Quiet and depressed affect  HENT:     Head: Normocephalic and atraumatic.     Right Ear: Tympanic membrane normal.     Left Ear: Tympanic membrane normal.     Nose: Nose normal.      Mouth/Throat:     Lips: Pink.     Tongue: No lesions.     Pharynx: Oropharynx is clear.  Eyes:     General: No scleral icterus.       Right eye: No discharge.        Left eye: No discharge.     Conjunctiva/sclera: Conjunctivae normal.     Pupils: Pupils are equal, round, and reactive to light.  Neck:     Musculoskeletal: Normal range of motion and neck supple.  Cardiovascular:     Rate and Rhythm: Normal rate and regular rhythm.  Pulmonary:     Effort: Pulmonary effort is normal. No respiratory distress.     Breath sounds: Normal breath sounds.  Abdominal:     General: There is no distension.     Palpations: Abdomen is soft.     Tenderness: There is no abdominal tenderness.  Skin:    General: Skin is warm and dry.  Neurological:     Mental Status: He is alert and oriented to person, place, and time.  Psychiatric:        Attention and Perception: Attention normal.        Mood and Affect: Mood is depressed. Affect is flat.        Behavior: Behavior is withdrawn. Behavior is cooperative.        Thought Content: Thought content does not include homicidal or suicidal ideation. Thought content does not include homicidal or suicidal plan.      ED Treatments / Results  Labs (all labs ordered are listed, but only abnormal results are displayed) Labs Reviewed  COMPREHENSIVE METABOLIC PANEL - Abnormal; Notable for the following components:      Result Value   CO2 20 (*)    Glucose, Bld 104 (*)    All other components within normal limits  ETHANOL  CBC WITH DIFFERENTIAL/PLATELET  TSH  RAPID URINE DRUG SCREEN, HOSP PERFORMED    EKG None  Radiology No results found.  Procedures Procedures (including critical care time)  Medications Ordered in ED Medications - No data to display   Initial Impression / Assessment and Plan / ED Course  I have reviewed the triage vital signs and the nursing notes.  Pertinent labs & imaging results that were available during my care of  the patient were reviewed by me and considered in my medical decision making (see chart for details).  23 year old male presents with depression and anxiety which is worse today per his mother. His vitals are normal. Exam is unremarkable. Labs are normal. He has been complaining of some intermittent chest pain. EKG  is SR. TSH was added because of this relatively new onset of depression. This was normal as well. UDS is still pending. He is medically cleared. It sounds like he doesn't like the side effects of the medicine he was started on. He was encouraged to bring this up with his psychiatrist.  Final Clinical Impressions(s) / ED Diagnoses   Final diagnoses:  Depression, unspecified depression type    ED Discharge Orders    None       Bethel Born, PA-C 03/10/18 1345    Geoffery Lyons, MD 03/10/18 [phone]
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
## 2018-03-10 NOTE — ED Triage Notes (Signed)
TTS done and SI sitter arrived to stay with PT.

## 2018-03-10 NOTE — ED Notes (Signed)
Mother at bedside - pt eating dinner.

## 2018-03-10 NOTE — ED Notes (Signed)
Report called to Heather, RN at [facility]. 

## 2018-03-10 NOTE — ED Notes (Signed)
Patient stated he was leaving. Armband was removed.

## 2018-03-10 NOTE — ED Notes (Signed)
Called [facility] to give report. They are dealing with an emergency currently and requests that this RN call back in 15-20 minutes.

## 2018-03-10 NOTE — ED Triage Notes (Signed)
TTS machine in room 7 green

## 2018-03-10 NOTE — ED Notes (Signed)
This RN called staffing to request a sitter, was told one isn't available but they will look for one.

## 2018-03-10 NOTE — ED Notes (Signed)
[facility] called for transport. 

## 2018-03-10 NOTE — ED Notes (Signed)
Pt arrived to Rm 49 -  Ambulatory - wearing burgundy scrubs. Sitter w/pt.

## 2018-03-10 NOTE — ED Triage Notes (Signed)
Pt reports depression and SI with out a plan .

## 2018-03-10 NOTE — ED Notes (Signed)
Pt voiced understanding and agreement w/tx plan - accepted to [facility] - signed consent form - copy faxed to [facility], copy sent to Medical Records, and original placed in envelope for [facility]. Pt called and notified his mother. Mother had taken all belongings.

## 2018-03-10 NOTE — ED Triage Notes (Signed)
Pt's Mother has all his Personal belongings

## 2018-03-10 NOTE — BH Assessment (Signed)
Per Miki Kins, pt accepted to [facility] after 8 pm to [id number]. Daryl at [facility] notified by Miki Kins.

## 2018-03-10 NOTE — ED Notes (Signed)
Pt has been accepted to [facility] - after 1900.

## 2018-03-10 NOTE — ED Notes (Signed)
Mother visited w/pt - voiced aware and agreement w/tx plan -accepted to [facility].

## 2018-03-10 NOTE — ED Triage Notes (Signed)
Pt c/o headache with sensitivity to light and sound x 4 days. A&O x 4, no neuro deficits noted.

## 2018-03-11 ENCOUNTER — Encounter (HOSPITAL_COMMUNITY): Payer: Self-pay

## 2018-03-11 DIAGNOSIS — F333 Major depressive disorder, recurrent, severe with psychotic symptoms: Principal | ICD-10-CM

## 2018-03-11 LAB — TSH: TSH: 0.65 u[IU]/mL (ref 0.350–4.500)

## 2018-03-11 LAB — LIPID PANEL
Cholesterol: 165 mg/dL (ref 0–200)
HDL: 37 mg/dL — ABNORMAL LOW (ref >40)
LDL Cholesterol: 121 mg/dL — ABNORMAL HIGH (ref 0–99)
Total CHOL/HDL Ratio: 4.5 RATIO
Triglycerides: 34 mg/dL (ref <150)
VLDL: 7 mg/dL (ref 0–40)

## 2018-03-11 LAB — HEMOGLOBIN A1C
Hgb A1c MFr Bld: 4.7 % — ABNORMAL LOW (ref 4.8–5.6)
MEAN PLASMA GLUCOSE: 88.19 mg/dL

## 2018-03-11 MED ORDER — BENZTROPINE MESYLATE 0.5 MG PO TABS
0.5000 mg | ORAL_TABLET | Freq: Two times a day (BID) | ORAL | Status: DC
  Administered 2018-03-11 – 2018-03-13 (×5): 0.5 mg via ORAL
  Filled 2018-03-11: qty 1
  Filled 2018-03-11: qty 14
  Filled 2018-03-11 (×7): qty 1
  Filled 2018-03-11: qty 14

## 2018-03-11 MED ORDER — INFLUENZA VAC SPLIT QUAD 0.5 ML IM SUSY: 0.5000 mL | PREFILLED_SYRINGE | INTRAMUSCULAR | Status: DC

## 2018-03-11 MED ORDER — RISPERIDONE 2 MG PO TABS
2.0000 mg | ORAL_TABLET | Freq: Two times a day (BID) | ORAL | Status: DC
  Administered 2018-03-11 – 2018-03-13 (×5): 2 mg via ORAL
  Filled 2018-03-11 (×2): qty 1
  Filled 2018-03-11: qty 14
  Filled 2018-03-11 (×3): qty 1
  Filled 2018-03-11: qty 14
  Filled 2018-03-11 (×2): qty 1

## 2018-03-11 MED ORDER — PRENATAL MULTIVITAMIN CH
1.0000 | ORAL_TABLET | Freq: Every day | ORAL | Status: DC
  Administered 2018-03-11 – 2018-03-12 (×2): 1 via ORAL
  Filled 2018-03-11 (×4): qty 1

## 2018-03-11 MED ORDER — CLONIDINE HCL 0.1 MG PO TABS
0.1000 mg | ORAL_TABLET | Freq: Two times a day (BID) | ORAL | Status: DC
  Administered 2018-03-11 – 2018-03-13 (×5): 0.1 mg via ORAL
  Filled 2018-03-11 (×10): qty 1

## 2018-03-11 MED ORDER — FLUOXETINE HCL 20 MG PO CAPS
20.0000 mg | ORAL_CAPSULE | Freq: Every day | ORAL | Status: DC
  Administered 2018-03-11 – 2018-03-13 (×3): 20 mg via ORAL
  Filled 2018-03-11 (×4): qty 1
  Filled 2018-03-11: qty 7
  Filled 2018-03-11: qty 1

## 2018-03-11 NOTE — Progress Notes (Signed)
Patient ID: Bryan Kirk, male   DOB: Nov 08, 1995, 23 y.o.   MRN: [id number]  Nursing Progress Note [id number]  On initial approach, patient remained resting in bed but was compliant with EKG, vitals and medications. Patient presents with flat, sullen affect but is calm and cooperative. Patient was observed performing hygiene this morning and taking a shower. Patient currently denies SI/HI/AVH. Patient denies concerns for writer this morning. Patient did not attend group but is interactive with his roommate. Patient agrees to make needs known to staff.  Patient is educated about and provided medication per provider's orders. Patient safety maintained with q15 min safety checks and low fall risk precautions. Emotional support given, 1:1 interaction, and active listening provided. Patient encouraged to attend meals, groups, and work on treatment plan and goals. Labs, vital signs and patient behavior monitored throughout shift.   Patient contracts for safety with staff. Patient remains safe on the unit at this time and agrees to come to staff with any issues/concerns. Patient is interacting with peers appropriately on the unit. No EPS or medication side effects noted. Will continue to support and monitor.   Patient's self-inventory sheet Rated Energy Level    Rated Sleep    Rated Appetite    Rated Anxiety (0-10)    Rated Hopelessness (0-10)   Rated Depression (0-10)   Daily Goal   Any Additional Comments:

## 2018-03-11 NOTE — [facility] Group Notes (Signed)
[facility] LCSW Group Therapy Note  Date/Time:  03/11/2018  11:00AM-12:00PM  Type of Therapy and Topic:  Group Therapy:  Music and Mood  Participation Level:  Minimal   Description of Group: In this process group, members listened to a variety of genres of music and identified that different types of music evoke different responses.  Patients were encouraged to identify music that was soothing for them and music that was energizing for them.  Patients discussed how this knowledge can help with wellness and recovery in various ways including managing depression and anxiety as well as encouraging healthy sleep habits.    Therapeutic Goals: 1. Patients will explore the impact of different varieties of music on mood 2. Patients will verbalize the thoughts they have when listening to different types of music 3. Patients will identify music that is soothing to them as well as music that is energizing to them 4. Patients will discuss how to use this knowledge to assist in maintaining wellness and recovery 5. Patients will explore the use of music as a coping skill  Summary of Patient Progress:  At the beginning of group, patient was not present.  He only arrived for the last 15 minutes of group.  During that time, he kept his head down.  He said one song made him feel "calm" and one song made him feel "sad."  Therapeutic Modalities: Solution Focused Brief Therapy Activity   Bryan Mantle, LCSW
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
## 2018-03-11 NOTE — Tx Team (Signed)
Initial Treatment Plan 03/11/2018 1:55 AM Bryan Kirk IDC:[id number]    PATIENT STRESSORS: Marital or family conflict Occupational concerns   PATIENT STRENGTHS: General fund of knowledge Motivation for treatment/growth Physical Health Supportive family/friends   PATIENT IDENTIFIED PROBLEMS: "want to work on staying calm" "want to get some sleep"                      DISCHARGE CRITERIA:  Improved stabilization in mood, thinking, and/or behavior Need for constant or close observation no longer present Reduction of life-threatening or endangering symptoms to within safe limits  PRELIMINARY DISCHARGE PLAN: Return to previous living arrangement  PATIENT/FAMILY INVOLVEMENT: This treatment plan has been presented to and reviewed with the patient, Bryan Kirk.  The patient and family have been given the opportunity to ask questions and make suggestions.  Edwyna Perfect, RN 03/11/2018, 1:55 AM
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
## 2018-03-11 NOTE — Plan of Care (Signed)
  Problem: Education: Goal: Knowledge of [facility] Education information/materials will improve Outcome: Progressing   Problem: Activity: Goal: Interest or engagement in activities will improve Outcome: Progressing   Problem: Coping: Goal: Ability to demonstrate self-control will improve Outcome: Progressing   Problem: Health Behavior/Discharge Planning: Goal: Compliance with treatment plan for underlying cause of condition will improve Outcome: Progressing   Problem: Safety: Goal: Periods of time without injury will increase Outcome: Progressing
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
## 2018-03-12 LAB — PROLACTIN: Prolactin: 34.9 ng/mL — ABNORMAL HIGH (ref 4.0–15.2)

## 2018-03-12 MED ORDER — OMEGA-3-ACID ETHYL ESTERS 1 G PO CAPS
1.0000 g | ORAL_CAPSULE | Freq: Two times a day (BID) | ORAL | Status: DC
  Administered 2018-03-12 – 2018-03-13 (×3): 1 g via ORAL
  Filled 2018-03-12 (×7): qty 1

## 2018-03-12 MED ORDER — PRAZOSIN HCL 2 MG PO CAPS
2.0000 mg | ORAL_CAPSULE | Freq: Every day | ORAL | Status: DC
  Administered 2018-03-12: 2 mg via ORAL
  Filled 2018-03-12: qty 1
  Filled 2018-03-12: qty 7
  Filled 2018-03-12: qty 1

## 2018-03-12 MED ORDER — PRENATAL MULTIVITAMIN CH: 1.0000 | ORAL_TABLET | Freq: Every day | ORAL | Status: DC

## 2018-03-12 NOTE — Progress Notes (Signed)
D:  Bryan Kirk was up and visible on the unit this evening.  He did attend evening wrap up group.  He denied SI/HI but continued to complain of mumbling voices no visual hallucinations.  He took his hs medications without difficulty.  Minimal interaction with staff and peers but was pleasant and cooperative.  He is currently resting with his eyes closed and appears to be asleep. A:  1:1 with RN for support and encouragement.  Medications as ordered.  Q 15 minute checks maintained for safety.  Encouraged participation in group and unit activities.   R:  Bryan Kirk remains safe on the unit.  We will continue to monitor the progress towards his goals.

## 2018-03-12 NOTE — Plan of Care (Signed)
  Problem: Education: Goal: Knowledge of [facility] Education information/materials will improve Outcome: Progressing   Problem: Activity: Goal: Interest or engagement in activities will improve Outcome: Progressing Goal: Sleeping patterns will improve Outcome: Progressing   Problem: Coping: Goal: Ability to demonstrate self-control will improve Outcome: Progressing   Problem: Health Behavior/Discharge Planning: Goal: Compliance with treatment plan for underlying cause of condition will improve Outcome: Progressing   Problem: Safety: Goal: Periods of time without injury will increase Outcome: Progressing

## 2018-03-12 NOTE — [facility] Suicide Risk Assessment (Signed)
[facility] INPATIENT:  Family/Significant Other Suicide Prevention Education  Suicide Prevention Education:  Education Completed; Alen Suite, mother, [phone], has been identified by the patient as the family member/significant other with whom the patient will be residing, and identified as the person(s) who will aid the patient in the event of a mental health crisis (suicidal ideations/suicide attempt).  With written consent from the patient, the family member/significant other has been provided the following suicide prevention education, prior to the and/or following the discharge of the patient.  The suicide prevention education provided includes the following:  Suicide risk factors  Suicide prevention and interventions  National Suicide Hotline telephone number  [facility] assessment telephone number  [facility] Emergency Assistance 911  [facility] and/or Residential Mobile Crisis Unit telephone number  Request made of family/significant other to:  Remove weapons (e.g., guns, rifles, knives), all items previously/currently identified as safety concern.  No guns in the home, per mother.    Remove drugs/medications (over-the-counter, prescriptions, illicit drugs), all items previously/currently identified as a safety concern.  The family member/significant other verbalizes understanding of the suicide prevention education information provided.  The family member/significant other agrees to remove the items of safety concern listed above.  Mother visited with pt yesterday, felt like he was doing better and he wants to come home.  Discussed discharge tomorrow.  Discussed follow up at [facility].    Lorri Frederick, LCSW 03/12/2018, 3:19 PM
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
## 2018-03-12 NOTE — Progress Notes (Addendum)
CSW spoke with Laurey Arrow, therapist at [facility].  She can meet pt on 03/14/18 at 1pm.  She has already referred pt for med mgmt but has not heard an appt date yet--she will contact them and get back to CSW regarding meds.  They can be the med provider.  CSW informed her that pt will have 7 day supply of medication along with 30 day prescriptions. Garner Nash, MSW, LCSW Clinical Social Worker 03/12/2018 1:04 PM

## 2018-03-12 NOTE — Tx Team (Signed)
Interdisciplinary Treatment and Diagnostic Plan Update  03/12/2018 Time of Session: [place] MRN: [id number]  Principal Diagnosis: <principal problem not specified>  Secondary Diagnoses: Active Problems:   MDD (major depressive disorder), recurrent, severe, with psychosis ([place])   Current Medications:  Current Facility-Administered Medications  Medication Dose Route Frequency Provider Last Rate Last Dose  . acetaminophen (TYLENOL) tablet 650 mg  650 mg Oral Q6H PRN [place], NP      . alum & mag hydroxide-simeth (MAALOX/MYLANTA) 200-200-20 MG/5ML suspension 30 mL  30 mL Oral Q4H PRN Derrill Center, NP      . benztropine (COGENTIN) tablet 0.5 mg  0.5 mg Oral BID Johnn Hai, MD   0.5 mg at 03/12/18 0745  . cloNIDine (CATAPRES) tablet 0.1 mg  0.1 mg Oral BID Johnn Hai, MD   0.1 mg at 03/12/18 0745  . FLUoxetine (PROZAC) capsule 20 mg  20 mg Oral Daily Johnn Hai, MD   20 mg at 03/12/18 0745  . magnesium hydroxide (MILK OF MAGNESIA) suspension 30 mL  30 mL Oral Daily PRN Derrill [place], NP      . omega-3 acid ethyl esters (LOVAZA) capsule 1 g  1 g Oral BID Johnn Hai, MD   1 g at 03/12/18 0925  . prazosin (MINIPRESS) capsule 2 mg  2 mg Oral QHS Johnn Hai, MD      . prenatal multivitamin tablet 1 tablet  1 tablet Oral Q1200 Johnn Hai, MD   1 tablet at 03/11/18 1100  . risperiDONE (RISPERDAL) tablet 2 mg  2 mg Oral BID Johnn Hai, MD   2 mg at 03/12/18 0745  . temazepam (RESTORIL) capsule 30 mg  30 mg Oral QHS PRN Lindon Romp A, NP   30 mg at 03/11/18 2114   PTA Medications: Medications Prior to Admission  Medication Sig Dispense Refill Last Dose  . brompheniramine-pseudoephedrine-DM 30-2-10 MG/5ML syrup Take 5 mLs by mouth 4 (four) times daily as needed. (Patient not taking: Reported on 03/10/2018) 120 mL 0 Not Taking at Unknown time  . Cetirizine HCl 10 MG CAPS Take 1 capsule (10 mg total) by mouth daily for 10 days. (Patient not taking: Reported  on 03/10/2018) 10 capsule 0 Not Taking at Unknown time  . hydrOXYzine (ATARAX/VISTARIL) 25 MG tablet Take 1 tablet (25 mg total) by mouth every 6 (six) hours. (Patient not taking: Reported on 03/10/2018) 12 tablet 0 Not Taking at Unknown time  . ibuprofen (ADVIL,MOTRIN) 600 MG tablet Take 1 tablet (600 mg total) by mouth every 6 (six) hours as needed. (Patient taking differently: Take 600 mg by mouth every 6 (six) hours as needed for headache. ) 30 tablet 0 03/09/2018 at Unknown time  . OLANZapine (ZYPREXA) 10 MG tablet Take 0.5 tablets (5 mg total) by mouth 2 (two) times daily. 60 tablet 0 03/09/2018 at pm    Patient Stressors: Marital or family conflict Occupational concerns  Patient Strengths: General fund of knowledge Motivation for treatment/growth Physical Health Supportive family/friends  Treatment Modalities: Medication Management, Group therapy, Case management,  1 to 1 session with clinician, Psychoeducation, Recreational therapy.   Physician Treatment Plan for Primary Diagnosis: <principal problem not specified> Long Term Goal(s): Improvement in symptoms so as ready for discharge Improvement in symptoms so as ready for discharge   Short Term Goals: Ability to demonstrate self-control will improve Ability to identify and develop effective coping behaviors will improve Compliance with prescribed medications will improve Ability to identify triggers associated with substance  abuse/mental health issues will improve  Medication Management: Evaluate patient's response, side effects, and tolerance of medication regimen.  Therapeutic Interventions: 1 to 1 sessions, Unit Group sessions and Medication administration.  Evaluation of Outcomes: Not Met  Physician Treatment Plan for Secondary Diagnosis: Active Problems:   MDD (major depressive disorder), recurrent, severe, with psychosis ([place])  Long Term Goal(s): Improvement in symptoms so as ready for discharge Improvement in symptoms  so as ready for discharge   Short Term Goals: Ability to demonstrate self-control will improve Ability to identify and develop effective coping behaviors will improve Compliance with prescribed medications will improve Ability to identify triggers associated with substance abuse/mental health issues will improve     Medication Management: Evaluate patient's response, side effects, and tolerance of medication regimen.  Therapeutic Interventions: 1 to 1 sessions, Unit Group sessions and Medication administration.  Evaluation of Outcomes: Not Met   RN Treatment Plan for Primary Diagnosis: <principal problem not specified> Long Term Goal(s): Knowledge of disease and therapeutic regimen to maintain health will improve  Short Term Goals: Ability to identify and develop effective coping behaviors will improve and Compliance with prescribed medications will improve  Medication Management: RN will administer medications as ordered by provider, will assess and evaluate patient's response and provide education to patient for prescribed medication. RN will report any adverse and/or side effects to prescribing provider.  Therapeutic Interventions: 1 on 1 counseling sessions, Psychoeducation, Medication administration, Evaluate responses to treatment, Monitor vital signs and CBGs as ordered, Perform/monitor CIWA, COWS, AIMS and Fall Risk screenings as ordered, Perform wound care treatments as ordered.  Evaluation of Outcomes: Not Met   LCSW Treatment Plan for Primary Diagnosis: <principal problem not specified> Long Term Goal(s): Safe transition to appropriate next level of care at discharge, Engage patient in therapeutic group addressing interpersonal concerns.  Short Term Goals: Engage patient in aftercare planning with referrals and resources, Increase social support and Increase skills for wellness and recovery  Therapeutic Interventions: Assess for all discharge needs, 1 to 1 time with Social  worker, Explore available resources and support systems, Assess for adequacy in community support network, Educate family and significant other(s) on suicide prevention, Complete Psychosocial Assessment, Interpersonal group therapy.  Evaluation of Outcomes: Not Met   Progress in Treatment: Attending groups: Yes. Participating in groups: No. Taking medication as prescribed: Yes. Toleration medication: Yes. Family/Significant other contact made: No, will contact:  mother Patient understands diagnosis: Yes. Discussing patient identified problems/goals with staff: Yes. Medical problems stabilized or resolved: Yes. Denies suicidal/homicidal ideation: Yes. Issues/concerns per patient self-inventory: No. Other: none  New problem(s) identified: No, Describe:  none  New Short Term/Long Term Goal(s):  Patient Goals:  "stay calm and focused"  Discharge Plan or Barriers:   Reason for Continuation of Hospitalization: Anxiety Delusions  Depression Hallucinations Medication stabilization  Estimated Length of Stay: 1 day  Attendees: Patient:Bryan Kirk 03/12/2018   Physician: Dr. Jake Samples, MD 03/12/2018   Nursing: Baldo Daub, RN 03/12/2018   RN Care Manager: 03/12/2018   Social Worker: Lurline Idol, LCSW 03/12/2018   Recreational Therapist:  03/12/2018   Other:  03/12/2018   Other:  03/12/2018   Other: 03/12/2018        Scribe for Treatment Team: Joanne Chars, LCSW 03/12/2018 11:19 AM

## 2018-03-12 NOTE — Progress Notes (Signed)
Recreation Therapy Notes  INPATIENT RECREATION THERAPY ASSESSMENT  Patient Details Name: Keivan Oxborrow MRN: [id number] DOB: Sep 14, 1995 Today's Date: 03/12/2018       Information Obtained From: Patient  Able to Participate in Assessment/Interview: Yes  Patient Presentation: Alert  Reason for Admission (Per Patient): Other (Comments)(Paranoid, jittery, scared)  Patient Stressors: Other (Comment)(Can't sleep)  Coping Skills:   Write, Sports, TV, Music, Exercise, Meditate, Deep Breathing, Substance Abuse, Talk, Prayer, Avoidance, Read, Dance, Hot Bath/Shower  Leisure Interests (2+):  Games - Video games, Sports - Basketball  Frequency of Recreation/Participation: Other (Comment)(Every other week)  Awareness of Community Resources:  Yes  Community Resources:  Library, [employer], Other (Comment)(Daycare)  Current Use: Yes  If no, Barriers?:    Expressed Interest in [employer] Information: No  [employer] of Residence:  Engineer, technical sales  Patient Main Form of Transportation: Set designer  Patient Strengths:  Automotive engineer; Caring for other people  Patient Identified Areas of Improvement:  Staying calm; Try to stay focused; keep brain occupied  Patient Goal for Hospitalization:  "stay calm"  Current SI (including self-harm):  No  Current HI:  No  Current AVH: No  Staff Intervention Plan: Group Attendance, Collaborate with Interdisciplinary Treatment Team  Consent to Intern Participation: N/A    Caroll Rancher, LRT/CTRS  Caroll Rancher A 03/12/2018, 3:08 PM

## 2018-03-12 NOTE — Progress Notes (Signed)
Patient ID: Bryan Kirk, male   DOB: 1995-05-29, 23 y.o.   MRN: [id number]  Patient remains paranoid and did not go to the cafeteria for lunch. Patient is seen up in the milieu but does not interact. Patient also refused to go to the cafeteria for dinner stating, "I'm just not very hungry". Patient was provided a meal tray and encouraged to eat. Patient states, "I eat snacks". PO fluids pushed. Patient observed eating a small amount of his dinner tray.

## 2018-03-12 NOTE — Progress Notes (Signed)
Patient ID: Bryan Kirk, male   DOB: 07/23/1995, 23 y.o.   MRN: [id number]  Nursing Progress Note [id number]  On initial approach, patient is seen returning from breakfast and is on the phone. Patient appears anxious and fearful stating to writing he is having paranoia and racing thoughts.  Patient compliant with scheduled medications. After receiving medications, patient appeared visibly less anxious and reported he was feeling better. Patient currently denies SI/HI but does endorse voices. Patient reports he is hopeful to discharge soon.   Patient is educated about and provided medication per provider's orders. Patient safety maintained with q15 min safety checks and low fall risk precautions. Emotional support given, 1:1 interaction, and active listening provided. Patient encouraged to attend meals, groups, and work on treatment plan and goals. Labs, vital signs and patient behavior monitored throughout shift.   Patient contracts for safety with staff. Patient remains safe on the unit at this time and agrees to come to staff with any issues/concerns. Patient is interacting with peers appropriately on the unit. Will continue to support and monitor.   Patient's self-inventory sheet Rated Energy Level  Good  Rated Sleep  Fair  Rated Appetite  Fair  Rated Anxiety (0-10)  9  Rated Hopelessness (0-10)  7  Rated Depression (0-10)  6  Daily Goal  "stay calm and be chill"  Any Additional Comments:

## 2018-03-12 NOTE — Plan of Care (Signed)
  Problem: Education: Goal: Verbalization of understanding the information provided will improve Outcome: Progressing   Problem: Activity: Goal: Sleeping patterns will improve Outcome: Progressing   

## 2018-03-12 NOTE — Progress Notes (Signed)
Recreation Therapy Notes  Date: 3.2.20 Time: 1000 Location: 500 Hall Dayroom  Group Topic: Coping Skills  Goal Area(s) Addresses:  Patient will identify triggers in which coping skills are needed. Patient will identify coping skills for each trigger. Patient will identify benefits of using coping skills post d/c.  Behavioral Response: None  Intervention: Worksheet, pencils  Activity: Mind map.  LRT and patients will fill in the first 8 boxes (anger, stress, PTSD, anxiety, depression, money management, being impatient, lack of sleep) together.  Patients were then given time to come up with at least 3 coping skills for each trigger individually.  The group would come back together and LRT would write the coping skills on the board.  Patient could then fill in any empty spaces that remained on their sheets.  Education: Pharmacologist, Building control surveyor.   Education Outcome: Acknowledges understanding/In group clarification offered/Needs additional education.   Clinical Observations/Feedback: Pt did not participate.  Pt sat through most of group with his head down and appearing to sleep.    Caroll Rancher, LRT/CTRS         Caroll Rancher A 03/12/2018 11:49 AM

## 2018-03-13 DIAGNOSIS — F1210 Cannabis abuse, uncomplicated: Secondary | ICD-10-CM

## 2018-03-13 DIAGNOSIS — F2081 Schizophreniform disorder: Secondary | ICD-10-CM

## 2018-03-13 MED ORDER — LORAZEPAM 0.5 MG PO TABS
1.5000 mg | ORAL_TABLET | Freq: Once | ORAL | Status: AC
  Administered 2018-03-13: 1.5 mg via ORAL
  Filled 2018-03-13: qty 3

## 2018-03-13 MED ORDER — LORAZEPAM 1 MG PO TABS: 1.0000 mg | ORAL_TABLET | Freq: Three times a day (TID) | ORAL | 0 refills | Status: DC | PRN

## 2018-03-13 MED ORDER — RISPERIDONE 4 MG PO TABS: 4.0000 mg | ORAL_TABLET | Freq: Every day | ORAL | 2 refills | Status: DC

## 2018-03-13 MED ORDER — PRAZOSIN HCL 2 MG PO CAPS: 2.0000 mg | ORAL_CAPSULE | Freq: Every day | ORAL | 2 refills | Status: DC

## 2018-03-13 MED ORDER — TEMAZEPAM 30 MG PO CAPS: 30.0000 mg | ORAL_CAPSULE | Freq: Every evening | ORAL | 0 refills | Status: DC | PRN

## 2018-03-13 MED ORDER — FLUOXETINE HCL 20 MG PO CAPS: 20.0000 mg | ORAL_CAPSULE | Freq: Every day | ORAL | 3 refills | Status: DC

## 2018-03-13 MED ORDER — ENLYTE PO CAPS: 1.0000 | ORAL_CAPSULE | Freq: Every day | ORAL | 11 refills | Status: DC

## 2018-03-13 MED ORDER — BENZTROPINE MESYLATE 0.5 MG PO TABS: 0.5000 mg | ORAL_TABLET | Freq: Two times a day (BID) | ORAL | 2 refills | Status: DC

## 2018-03-13 MED ORDER — RISPERIDONE 2 MG PO TABS
4.0000 mg | ORAL_TABLET | Freq: Every day | ORAL | Status: DC
  Filled 2018-03-13: qty 14
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
## 2018-03-13 NOTE — Progress Notes (Signed)
Patient ID: Bryan Kirk, male   DOB: [birth year], 23 y.o.   MRN: [id number] Patient discharged to home/self care in the presence of his mother.  All discharge instructions explained to patient and his mother with patient's consent.  Patient and mother acknowledged understanding of all discharge instructions. Patient denies SI, HI and AVH upon discharge, but continues to report increased anxiety and fear.

## 2018-03-13 NOTE — Progress Notes (Signed)
  [facility] Adult Case Management Discharge Plan :  Will you be returning to the same living situation after discharge:  Yes,  with mother At discharge, do you have transportation home?: Yes,  mother Do you have the ability to pay for your medications: No. Will work with [facility].  Release of information consent forms completed and in the chart;  Patient's signature needed at discharge.  Patient to Follow up at: Follow-up Information    [facility], [employer]. Go on 03/14/2018.   Specialty:  Professional Counselor Why:  Please attend your therapy appt with Laurey Arrow on Wed, 03/14/18, at 1:00pm.  Lowella Bandy has referred your for medication management services and will give you more information about this at your therapy appt.  Contact information: [facility] [address] [place] [address] [phone]           Next level of care provider has access to [facility] Link:no  Safety Planning and Suicide Prevention discussed: Yes,  with mother  Have you used any form of tobacco in the last 30 days? (Cigarettes, Smokeless Tobacco, Cigars, and/or Pipes): No  Has patient been referred to the Quitline?: N/A patient is not a smoker  Patient has been referred for addiction treatment: Pt. refused referral  Lorri Frederick, LCSW 03/13/2018, 9:16 AM

## 2018-03-13 NOTE — Plan of Care (Signed)
D: Patient in bed awake on approach. Patient is pleasant and cooperative. Denies SI, HI, AVH, and verbally contracts for safety. Patient denies physical symptoms/pain. Patient reports an ok day but feeling anxious and depressed.    A: Scheduled medications administered per MD order. Support provided. Patient educated on safety on the unit and medications. Routine safety checks every 15 minutes. Patient stated understanding to tell nurse about any new physical symptoms. Patient understands to tell staff of any needs.     R: No adverse drug reactions noted. Patient verbally contracts for safety. Patient remains safe at this time and will continue to monitor.   Problem: Education: Goal: Mental status will improve Outcome: Progressing   Problem: Safety: Goal: Periods of time without injury will increase Outcome: Progressing   Patient denies SI, HI, AVH, and contracts for safety. Patient remains safe and will continue to monitor.
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
## 2018-04-16 ENCOUNTER — Encounter (HOSPITAL_COMMUNITY): Payer: Self-pay

## 2018-04-16 ENCOUNTER — Ambulatory Visit (HOSPITAL_COMMUNITY)
Admission: EM | Admit: 2018-04-16 | Discharge: 2018-04-16 | Disposition: A | Discharge status: Home / Self Care | Payer: Medicaid Other | Attending: Family Medicine | Admitting: Family Medicine

## 2018-04-16 ENCOUNTER — Other Ambulatory Visit: Payer: Self-pay

## 2018-04-16 DIAGNOSIS — J3089 Other allergic rhinitis: Secondary | ICD-10-CM

## 2018-04-16 DIAGNOSIS — H9202 Otalgia, left ear: Secondary | ICD-10-CM

## 2018-04-16 HISTORY — DX: Depression, unspecified: F32.A

## 2018-04-16 HISTORY — DX: Major depressive disorder, single episode, unspecified: F32.9

## 2018-04-16 HISTORY — DX: Anxiety disorder, unspecified: F41.9

## 2018-04-16 MED ORDER — CETIRIZINE HCL 10 MG PO TABS: 10.0000 mg | ORAL_TABLET | Freq: Every day | ORAL | 0 refills | Status: AC

## 2018-04-16 MED ORDER — FLUTICASONE PROPIONATE 50 MCG/ACT NA SUSP: 2.0000 | Freq: Every day | NASAL | 0 refills | Status: AC

## 2018-04-16 NOTE — Discharge Instructions (Signed)
No signs of ear infection. Symptoms most likely caused by allergies. Start zyrtec and flonase as directed. Keep hydrated, urine should be clear to pale yellow in color. Follow up with PCP if symptoms still not improving.

## 2018-04-16 NOTE — ED Provider Notes (Signed)
[facility]    CSN: [id number] Arrival date & time: 04/16/18  [id number]     History   Chief Complaint Chief Complaint  Patient presents with  . Otalgia    HPI Bryan Kirk is a 23 y.o. male.   23 year old male comes in for 2-day history of left ear pain.  States pain is intermittent, no obvious aggravating or alleviating factor.  Has also experienced intermittent tinnitus.  Has been using cotton swabs, cannot remember if it started before or after the ear pain started.  Denies swimming.  Denies URI symptoms such as cough, congestion, sore throat.  Denies fever, chills, night sweats.  He does have history of seasonal allergies.  He has not tried any medicines for the symptoms.  No sick contact.     Past Medical History:  Diagnosis Date  . Anxiety   . Depression     Patient Active Problem List   Diagnosis Date Noted  . 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 Cannabis abuse 03/13/2018  . 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 Psychosis (HCC) 12/18/2017  . 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 No pertinent surgical history.     Home Medications    Prior to Admission medications   Medication Sig Start Date End Date Taking? Authorizing Provider  benztropine (COGENTIN) 0.5 MG tablet Take 1 tablet (0.5 mg total) by mouth 2 (two) times daily. 03/13/18   Malvin JohnsFarah, Brian, MD  brompheniramine-pseudoephedrine-DM 30-2-10 MG/5ML syrup Take 5 mLs by mouth 4 (four) times daily as needed. Patient not taking: Reported on 03/10/2018 02/07/18   Wieters, Fran LowesHallie C, PA-C  cetirizine (ZYRTEC) 10 MG tablet Take 1 tablet (10 mg total) by mouth daily. 04/16/18   Belinda FisherYu, Christia Domke V, PA-C  Dietary Management Product (ENLYTE) CAPS Take 1 capsule by mouth daily after breakfast. 03/13/18   Malvin JohnsFarah, Brian, MD  FLUoxetine (PROZAC) 20 MG capsule Take 1 capsule (20 mg total) by mouth daily. 03/14/18    Malvin JohnsFarah, Brian, MD  fluticasone (FLONASE) 50 MCG/ACT nasal spray Place 2 sprays into both nostrils daily. 04/16/18   Cathie HoopsYu, Cannon Quinton V, PA-C  ibuprofen (ADVIL,MOTRIN) 600 MG tablet Take 1 tablet (600 mg total) by mouth every 6 (six) hours as needed. Patient taking differently: Take 600 mg by mouth every 6 (six) hours as needed for headache.  02/07/18   Wieters, Hallie C, PA-C  LORazepam (ATIVAN) 1 MG tablet Take 1 tablet (1 mg total) by mouth every 8 (eight) hours as needed for anxiety. 03/13/18   Malvin JohnsFarah, Brian, MD  prazosin (MINIPRESS) 2 MG capsule Take 1 capsule (2 mg total) by mouth at bedtime. 03/13/18   Malvin JohnsFarah, Brian, MD  risperiDONE (RISPERDAL) 4 MG tablet Take 1 tablet (4 mg total) by mouth at bedtime. 03/13/18   Malvin JohnsFarah, Brian, MD  temazepam (RESTORIL) 30 MG capsule Take 1 capsule (30 mg total) by mouth at bedtime as needed for sleep. 03/13/18   Malvin JohnsFarah, Brian, MD    Family History Family History  Problem Relation Age of Onset  . Healthy Mother   . Healthy Father     Social History Social History   Tobacco Use  . Smoking status: Never Smoker  . Smokeless tobacco: Never Used  Substance Use Topics  . Alcohol use: Never    Frequency: Never  . Drug use: Yes    Types: Marijuana    Comment: last use 12/13/17     Allergies  Patient has no known allergies.   Review of Systems Review of Systems  Reason unable to perform ROS: See HPI as above.     Physical Exam Triage Vital Signs ED Triage Vitals  Enc Vitals Group     BP 04/16/18 1008 130/90     Pulse Rate 04/16/18 1008 100     Resp 04/16/18 1008 18     Temp 04/16/18 1008 98.5 F (36.9 C)     Temp src --      SpO2 04/16/18 1008 98 %     Weight 04/16/18 1006 190 lb (86.2 kg)     Height --      Head Circumference --      Peak Flow --      Pain Score 04/16/18 1006 5     Pain Loc --      Pain Edu? --      Excl. in GC? --    No data found.  Updated Vital Signs BP 130/90 (BP Location: Right Arm)   Pulse 100   Temp 98.5 F (36.9 C)    Resp 18   Wt 190 lb (86.2 kg)   SpO2 98%   BMI 32.11 kg/m   Physical Exam Constitutional:      General: He is not in acute distress.    Appearance: He is well-developed. He is not ill-appearing or diaphoretic.  HENT:     Head: Normocephalic and atraumatic.     Right Ear: Tympanic membrane, ear canal and external ear normal. No middle ear effusion. Tympanic membrane is not erythematous or bulging.     Left Ear: Tympanic membrane, ear canal and external ear normal.  No middle ear effusion. Tympanic membrane is not erythematous or bulging.     Ears:     Comments: No tenderness to palpation of bilateral tragus.    Nose: Congestion present.     Right Turbinates: Swollen.     Right Sinus: No maxillary sinus tenderness or frontal sinus tenderness.     Left Sinus: No maxillary sinus tenderness or frontal sinus tenderness.  Eyes:     Conjunctiva/sclera: Conjunctivae normal.     Pupils: Pupils are equal, round, and reactive to light.  Neurological:     Mental Status: He is alert and oriented to person, place, and time.    UC Treatments / Results  Labs (all labs ordered are listed, but only abnormal results are displayed) Labs Reviewed - No data to display  EKG None  Radiology No results found.  Procedures Procedures (including critical care time)  Medications Ordered in UC Medications - No data to display  Initial Impression / Assessment and Plan / UC Course  I have reviewed the triage vital signs and the nursing notes.  Pertinent labs & imaging results that were available during my care of the patient were reviewed by me and considered in my medical decision making (see chart for details).    No signs of otitis media/externa. Discussed possible allergies causing symptoms. Discussed possible eustachian tube dysfunction. Start zyrtec and flonase as directed. Return precautions given.  Final Clinical Impressions(s) / UC Diagnoses   Final diagnoses:  Left ear pain  Seasonal  allergic rhinitis due to other allergic trigger    ED Prescriptions    Medication Sig Dispense Auth. Provider   fluticasone (FLONASE) 50 MCG/ACT nasal spray Place 2 sprays into both nostrils daily. 1 g Marjean Imperato V, PA-C   cetirizine (ZYRTEC) 10 MG tablet Take 1 tablet (10 mg total)  by mouth daily. 15 tablet Threasa Alpha, [place] 04/16/18 1121

## 2018-04-16 NOTE — ED Notes (Signed)
Patient verbalizes understanding of discharge instructions. Opportunity for questioning and answers were provided. Patient discharged from [facility] by provider.  

## 2018-04-16 NOTE — ED Triage Notes (Signed)
Pt cc he states he has left ear pain x 2 days.

## 2018-05-04 ENCOUNTER — Ambulatory Visit (INDEPENDENT_AMBULATORY_CARE_PROVIDER_SITE_OTHER): Payer: Self-pay

## 2018-05-04 ENCOUNTER — Encounter (HOSPITAL_COMMUNITY): Payer: Self-pay | Admitting: Emergency Medicine

## 2018-05-04 ENCOUNTER — Ambulatory Visit (HOSPITAL_COMMUNITY)
Admission: EM | Admit: 2018-05-04 | Discharge: 2018-05-04 | Disposition: A | Discharge status: Home / Self Care | Payer: Self-pay

## 2018-05-04 ENCOUNTER — Other Ambulatory Visit: Payer: Self-pay

## 2018-05-04 DIAGNOSIS — R079 Chest pain, unspecified: Secondary | ICD-10-CM

## 2018-05-04 MED ORDER — ALUM & MAG HYDROXIDE-SIMETH 200-200-20 MG/5ML PO SUSP
ORAL | Status: AC
  Filled 2018-05-04: qty 30

## 2018-05-04 MED ORDER — OMEPRAZOLE 20 MG PO CPDR: 20.0000 mg | DELAYED_RELEASE_CAPSULE | Freq: Every day | ORAL | 0 refills | Status: AC

## 2018-05-04 MED ORDER — LIDOCAINE VISCOUS HCL 2 % MT SOLN
OROMUCOSAL | Status: AC
  Filled 2018-05-04: qty 15

## 2018-05-04 MED ORDER — LIDOCAINE VISCOUS HCL 2 % MT SOLN
15.0000 mL | Freq: Once | OROMUCOSAL | Status: AC
  Administered 2018-05-04: 10:00:00 15 mL via ORAL

## 2018-05-04 MED ORDER — ALUM & MAG HYDROXIDE-SIMETH 200-200-20 MG/5ML PO SUSP
30.0000 mL | Freq: Once | ORAL | Status: AC
  Administered 2018-05-04: 30 mL via ORAL

## 2018-05-04 NOTE — ED Notes (Signed)
Patient able to ambulate independently  

## 2018-05-04 NOTE — ED Triage Notes (Signed)
Pt presents to [facility] for assessment of chest pain starting 3 days ago while smoking a cigarette.  Denies cough, fevers, or SOB.  C/o some intermittent nausea, states emesis 2-3 times yesterday.  Pt denies known hx of GERD>

## 2018-05-04 NOTE — Discharge Instructions (Addendum)
Your ekg and chest xray do show any new findings, which is reassuring.  Please start daily omeprazole.  See provided education about diet changes that may also prevent chest pain if related to gerd.  Tylenol as needed may also be helpful.  If worsening of pain, shortness of breath , fevers, palpitations, vomiting or otherwise worsening please go to the ER.  Please continue to decrease to quit smoking.

## 2018-05-04 NOTE — ED Provider Notes (Signed)
[facility]    CSN: [id number] Arrival date & time: 05/04/18  0909     History   Chief Complaint Chief Complaint  Patient presents with  . Chest Pain    HPI Bryan Kirk is a 23 y.o. male.   Bryan Kirk presents with complaints of sternal chest pain  Which started 2-3 days ago, making it difficult to sleep. He feels the pain is worsening. No specific exacerbating or aleveing factors although he feels that deep breaths may sometimes worsen the symptoms. Pain 6/10 in severity. Hasn't taken any medications for symptoms. Denies any previous similar. No abdominal pain. No cough, no fevers. Occasionally feels shortness of breath , denies currently. Has been eating and drinking. Vomited yesterday. Hasn't vomited since. No diarrhea. No current nausea. No recent travel or recent immobilization. No leg pain or swelling. Smokes black and milds but not every day. Hx of anxiety, depression, schizophrenia.     ROS per HPI, negative if not otherwise mentioned.      Past Medical History:  Diagnosis Date  . Anxiety   . Depression     Patient Active Problem List   Diagnosis Date Noted  . 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 Cannabis abuse 03/13/2018  . 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 Psychosis (HCC) 12/18/2017  . 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 No pertinent surgical history.     Home Medications    Prior to Admission medications   Medication Sig Start Date End Date Taking? Authorizing Provider  ARIPiprazole (ABILIFY) 10 MG tablet Take 10 mg by mouth daily.   Yes [provider]  divalproex (DEPAKOTE) 500 MG DR tablet Take 500 mg by mouth at bedtime as needed.   Yes [provider]  FLUoxetine (PROZAC) 20 MG capsule Take 1 capsule (20 mg total) by mouth daily. 03/14/18  Yes Malvin Johns, MD  hydrOXYzine  (ATARAX/VISTARIL) 50 MG tablet Take 50 mg by mouth 2 (two) times daily as needed.   Yes [provider]  benztropine (COGENTIN) 0.5 MG tablet Take 1 tablet (0.5 mg total) by mouth 2 (two) times daily. 03/13/18   Malvin Johns, MD  brompheniramine-pseudoephedrine-DM 30-2-10 MG/5ML syrup Take 5 mLs by mouth 4 (four) times daily as needed. Patient not taking: Reported on 03/10/2018 02/07/18   Wieters, Fran Lowes C, PA-C  cetirizine (ZYRTEC) 10 MG tablet Take 1 tablet (10 mg total) by mouth daily. 04/16/18   Belinda Fisher, PA-C  Dietary Management Product (ENLYTE) CAPS Take 1 capsule by mouth daily after breakfast. 03/13/18   Malvin Johns, MD  fluticasone [facility]) 50 MCG/ACT nasal spray Place 2 sprays into both nostrils daily. 04/16/18   Cathie Hoops, Amy V, PA-C  ibuprofen (ADVIL,MOTRIN) 600 MG tablet Take 1 tablet (600 mg total) by mouth every 6 (six) hours as needed. Patient taking differently: Take 600 mg by mouth every 6 (six) hours as needed for headache.  02/07/18   Wieters, Hallie C, PA-C  LORazepam (ATIVAN) 1 MG tablet Take 1 tablet (1 mg total) by mouth every 8 (eight) hours as needed for anxiety. 03/13/18   Malvin Johns, MD  omeprazole (PRILOSEC) 20 MG capsule Take 1 capsule (20 mg total) by mouth daily. 05/04/18   Georgetta Haber, NP  prazosin (MINIPRESS) 2 MG capsule Take 1 capsule (2 mg total) by mouth at bedtime. 03/13/18   Malvin Johns, MD  risperiDONE (RISPERDAL) 4 MG tablet Take  1 tablet (4 mg total) by mouth at bedtime. 03/13/18   Malvin JohnsFarah, Brian, MD  temazepam (RESTORIL) 30 MG capsule Take 1 capsule (30 mg total) by mouth at bedtime as needed for sleep. 03/13/18   Malvin JohnsFarah, Brian, MD    Family History Family History  Problem Relation Age of Onset  . Healthy Mother   . Healthy Father     Social History Social History   Tobacco Use  . Smoking status: Never Smoker  . Smokeless tobacco: Never Used  Substance Use Topics  . Alcohol use: Never    Frequency: Never  . Drug use: Yes    Types: Marijuana     Comment: last use 12/13/17     Allergies   Patient has no known allergies.   Review of Systems Review of Systems   Physical Exam Triage Vital Signs ED Triage Vitals  Enc Vitals Group     BP 05/04/18 0916 125/83     Pulse Rate 05/04/18 0916 88     Resp 05/04/18 0916 16     Temp 05/04/18 0916 98.5 F (36.9 C)     Temp Source 05/04/18 0916 Oral     SpO2 05/04/18 0916 96 %     Weight --      Height --      Head Circumference --      Peak Flow --      Pain Score 05/04/18 0919 6     Pain Loc --      Pain Edu? --      Excl. in GC? --    No data found.  Updated Vital Signs BP 125/83 (BP Location: Left Arm)   Pulse 88   Temp 98.5 F (36.9 C) (Oral)   Resp 16   SpO2 96%    Physical Exam Constitutional:      Appearance: He is well-developed.  Cardiovascular:     Rate and Rhythm: Normal rate and regular rhythm.     Heart sounds: Normal heart sounds.  Pulmonary:     Effort: Pulmonary effort is normal. No tachypnea, accessory muscle usage or respiratory distress.     Breath sounds: Normal breath sounds. No stridor.  Chest:     Chest wall: Tenderness present.       Comments: Left of sternum point tenderness on exam Abdominal:     Palpations: Abdomen is soft.     Tenderness: There is no abdominal tenderness.  Musculoskeletal:     Right lower leg: No edema.     Left lower leg: No edema.  Skin:    General: Skin is warm and dry.  Neurological:     General: No focal deficit present.     Mental Status: He is alert and oriented to person, place, and time.  Psychiatric:        Mood and Affect: Affect is flat.    EKG: NSR, rate 83. Previous EKG was available for review, without acute changes. No stwave changes as interpreted by me.    UC Treatments / Results  Labs (all labs ordered are listed, but only abnormal results are displayed) Labs Reviewed - No data to display  EKG None  Radiology Dg Chest 2 View  Result Date: 05/04/2018 CLINICAL DATA:  Chest pain.  EXAM: CHEST - 2 VIEW COMPARISON:  Radiographs of September 01, 2017. FINDINGS: The heart size and mediastinal contours are within normal limits. Both lungs are clear. No pneumothorax or pleural effusion is noted. The visualized skeletal structures are unremarkable. IMPRESSION:  No active cardiopulmonary disease. Electronically Signed   By: Lupita Raider M.D.   On: 05/04/2018 09:44    Procedures Procedures (including critical care time)  Medications Ordered in UC Medications  alum & mag hydroxide-simeth (MAALOX/MYLANTA) 200-200-20 MG/5ML suspension 30 mL (30 mLs Oral Given 05/04/18 0951)    And  lidocaine (XYLOCAINE) 2 % viscous mouth solution 15 mL (15 mLs Oral Given 05/04/18 0950)    Initial Impression / Assessment and Plan / UC Course  I have reviewed the triage vital signs and the nursing notes.  Pertinent labs & imaging results that were available during my care of the patient were reviewed by me and considered in my medical decision making (see chart for details).     EKG and chest xray reassuring. Patient endorses symptoms improving while here s/p gi cocktail. No red flag findings. Omeprazole provided to start daily. Return precautions provided. Patient verbalized understanding and agreeable to plan.   Ambulatory out of clinic without difficulty.    Final Clinical Impressions(s) / UC Diagnoses   Final diagnoses:  Chest pain, unspecified type     Discharge Instructions     Your ekg and chest xray do show any new findings, which is reassuring.  Please start daily omeprazole.  See provided education about diet changes that may also prevent chest pain if related to gerd.  Tylenol as needed may also be helpful.  If worsening of pain, shortness of breath , fevers, palpitations, vomiting or otherwise worsening please go to the ER.  Please continue to decrease to quit smoking.      ED Prescriptions    Medication Sig Dispense Auth. Provider   omeprazole (PRILOSEC) 20 MG capsule  Take 1 capsule (20 mg total) by mouth daily. 30 capsule Georgetta Haber, NP     Controlled Substance Prescriptions [place] Controlled Substance Registry consulted? Not Applicable   Georgetta Haber, NP 05/04/18 1000

## 2018-05-05 ENCOUNTER — Other Ambulatory Visit: Payer: Self-pay

## 2018-05-05 ENCOUNTER — Encounter (HOSPITAL_COMMUNITY): Payer: Self-pay | Admitting: Emergency Medicine

## 2018-05-05 ENCOUNTER — Emergency Department (HOSPITAL_COMMUNITY): Payer: Self-pay

## 2018-05-05 ENCOUNTER — Emergency Department (HOSPITAL_COMMUNITY)
Admission: EM | Admit: 2018-05-05 | Discharge: 2018-05-06 | Disposition: A | Discharge status: Home / Self Care | Payer: Self-pay | Attending: Emergency Medicine | Admitting: Emergency Medicine

## 2018-05-05 DIAGNOSIS — F2081 Schizophreniform disorder: Secondary | ICD-10-CM

## 2018-05-05 DIAGNOSIS — R079 Chest pain, unspecified: Secondary | ICD-10-CM | POA: Insufficient documentation

## 2018-05-05 DIAGNOSIS — F259 Schizoaffective disorder, unspecified: Secondary | ICD-10-CM | POA: Insufficient documentation

## 2018-05-05 DIAGNOSIS — Z79899 Other long term (current) drug therapy: Secondary | ICD-10-CM | POA: Insufficient documentation

## 2018-05-05 DIAGNOSIS — F22 Delusional disorders: Secondary | ICD-10-CM | POA: Insufficient documentation

## 2018-05-05 LAB — CBC
HCT: 49 % (ref 39.0–52.0)
Hemoglobin: 16.5 g/dL (ref 13.0–17.0)
MCH: 29.7 pg (ref 26.0–34.0)
MCHC: 33.7 g/dL (ref 30.0–36.0)
MCV: 88.3 fL (ref 80.0–100.0)
Platelets: 302 10*3/uL (ref 150–400)
RBC: 5.55 MIL/uL (ref 4.22–5.81)
RDW: 11.9 % (ref 11.5–15.5)
WBC: 9.6 10*3/uL (ref 4.0–10.5)
nRBC: 0 % (ref 0.0–0.2)

## 2018-05-05 LAB — RAPID URINE DRUG SCREEN, HOSP PERFORMED
Amphetamines: NOT DETECTED
Barbiturates: NOT DETECTED
Benzodiazepines: NOT DETECTED
Cocaine: NOT DETECTED
Opiates: NOT DETECTED
Tetrahydrocannabinol: POSITIVE — AB

## 2018-05-05 LAB — COMPREHENSIVE METABOLIC PANEL
ALT: 28 U/L (ref 0–44)
AST: 19 U/L (ref 15–41)
Albumin: 4.2 g/dL (ref 3.5–5.0)
Alkaline Phosphatase: 68 U/L (ref 38–126)
Anion gap: 9 (ref 5–15)
BUN: 11 mg/dL (ref 6–20)
CO2: 22 mmol/L (ref 22–32)
Calcium: 8.9 mg/dL (ref 8.9–10.3)
Chloride: 107 mmol/L (ref 98–111)
Creatinine, Ser: 1.07 mg/dL (ref 0.61–1.24)
GFR calc Af Amer: >60 mL/min (ref >60)
GFR calc non Af Amer: >60 mL/min (ref >60)
Glucose, Bld: 98 mg/dL (ref 70–99)
Potassium: 5 mmol/L (ref 3.5–5.1)
Sodium: 138 mmol/L (ref 135–145)
Total Bilirubin: 0.5 mg/dL (ref 0.3–1.2)
Total Protein: 7.5 g/dL (ref 6.5–8.1)

## 2018-05-05 LAB — ETHANOL: Alcohol, Ethyl (B): <10 mg/dL (ref <10)

## 2018-05-05 LAB — TROPONIN I: Troponin I: <0.03 ng/mL (ref <0.03)

## 2018-05-05 LAB — D-DIMER, QUANTITATIVE: D-Dimer, Quant: 0.27 ug/mL-FEU (ref 0.00–0.50)

## 2018-05-05 MED ORDER — ALUM & MAG HYDROXIDE-SIMETH 200-200-20 MG/5ML PO SUSP
30.0000 mL | Freq: Once | ORAL | Status: AC
  Administered 2018-05-05: 18:00:00 30 mL via ORAL
  Filled 2018-05-05: qty 30

## 2018-05-05 MED ORDER — FLUTICASONE PROPIONATE 50 MCG/ACT NA SUSP
2.0000 | Freq: Every day | NASAL | Status: DC
  Administered 2018-05-05: 22:00:00 2 via NASAL
  Filled 2018-05-05: qty 16

## 2018-05-05 MED ORDER — PANTOPRAZOLE SODIUM 40 MG PO TBEC
40.0000 mg | DELAYED_RELEASE_TABLET | Freq: Every day | ORAL | Status: DC
  Administered 2018-05-05 – 2018-05-06 (×2): 40 mg via ORAL
  Filled 2018-05-05 (×2): qty 1

## 2018-05-05 MED ORDER — ARIPIPRAZOLE 10 MG PO TABS
10.0000 mg | ORAL_TABLET | Freq: Every day | ORAL | Status: DC
  Administered 2018-05-05 – 2018-05-06 (×2): 10 mg via ORAL
  Filled 2018-05-05 (×2): qty 1

## 2018-05-05 MED ORDER — FLUOXETINE HCL 20 MG PO CAPS
20.0000 mg | ORAL_CAPSULE | Freq: Every day | ORAL | Status: DC
  Administered 2018-05-05 – 2018-05-06 (×2): 20 mg via ORAL
  Filled 2018-05-05 (×2): qty 1

## 2018-05-05 MED ORDER — DIVALPROEX SODIUM 250 MG PO DR TAB
500.0000 mg | DELAYED_RELEASE_TABLET | Freq: Every day | ORAL | Status: DC
  Administered 2018-05-05: 500 mg via ORAL
  Filled 2018-05-05 (×2): qty 2

## 2018-05-05 MED ORDER — ACETAMINOPHEN 325 MG PO TABS
650.0000 mg | ORAL_TABLET | Freq: Once | ORAL | Status: AC
  Administered 2018-05-05: 650 mg via ORAL
  Filled 2018-05-05: qty 2

## 2018-05-05 MED ORDER — HYDROXYZINE HCL 50 MG PO TABS
50.0000 mg | ORAL_TABLET | Freq: Two times a day (BID) | ORAL | Status: DC | PRN
  Administered 2018-05-05: 50 mg via ORAL
  Filled 2018-05-05: qty 1

## 2018-05-05 NOTE — ED Notes (Signed)
Patient asked RN if someone could sit in the room with him, he states "I hate being alone." pt advised there is no staff member that is able to sit with him at this time; Pt has denied SI/HI; patient is in room that RN is able to see patient; -[facility]

## 2018-05-05 NOTE — ED Notes (Signed)
Pt wandering in the hallway  Asked the pt to stay in his room for his protection and others

## 2018-05-05 NOTE — ED Notes (Signed)
Patient has come to the desk multiple times with different request; pt asked what time sleep meds are given; pt advised  Has no meds ordered at this time; pt is tearful and again asked for RN to sit in the room with him; pt states that he hates being alone and having someone in the room keeps him calm; Safety sitter ordered for [employer]

## 2018-05-05 NOTE — ED Notes (Signed)
Patient brought to room 49 but had to be coached by staff to come into unit; Pt appears very anxious and paranoid; pt called mother and demanded she come see him; pt was advised there are no visitors in the hospital; Pt redirected back to his room-Monique,RN

## 2018-05-05 NOTE — ED Notes (Signed)
Pt came out to make another phone call, pt calling the person "Smirf" and asked them to stay on the  Phone with him; pt keeps stating he is breaking out in sweats and unable to sleep; Pt appeared to be getting more anxious by being on the phone call; RN asked patient to end call due to seen increased anxiety with phone call; pt asked RN to speak with person, when RN inquired who person was on the phone who he had been calling "Smirf", pt states "it's my mom"; RN declined to speak with unknown person; patient again was told that he was awaiting to be [facility] and a decision about care will be made after assessment; pt redirected to room; Staffing has ensure sitter will be  Available for pt at 11pm; EDP has been notified to place home meds and anxiety medication-Monique,RN

## 2018-05-05 NOTE — ED Triage Notes (Signed)
Pt c/o chest pain x 2 days with mild shortness of breath. Reports hx of anxiety.

## 2018-05-05 NOTE — Discharge Instructions (Signed)
You have been seen today for chest pain and paranoia. Please read and follow all provided instructions.   1. Medications: usual home medications 2. Treatment: rest, drink plenty of fluids 3. Follow Up: Please follow up with your primary doctor in 2 days for discussion of your diagnoses and further evaluation after today's visit; if you do not have a primary care doctor use the resource guide provided to find one; Please return to the ER for any new or worsening symptoms. Please obtain all of your results from medical records or have your doctors office obtain the results - share them with your doctor - you should be seen at your doctors office. Call today to arrange your follow up.   Take medications as prescribed. Please review all of the medicines and only take them if you do not have an allergy to them. Return to the emergency room for worsening condition or new concerning symptoms. Follow up with your regular doctor. If you don't have a regular doctor use one of the numbers below to establish a primary care doctor.  Please be aware that if you are taking birth control pills, taking other prescriptions, ESPECIALLY ANTIBIOTICS may make the birth control ineffective - if this is the case, either do not engage in sexual activity or use alternative methods of birth control such as condoms until you have finished the medicine and your family doctor says it is OK to restart them. If you are on a blood thinner such as COUMADIN, be aware that any other medicine that you take may cause the coumadin to either work too much, or not enough - you should have your coumadin level rechecked in next 7 days if this is the case.  ?  It is also a possibility that you have an allergic reaction to any of the medicines that you have been prescribed - Everybody reacts differently to medications and while MOST people have no trouble with most medicines, you may have a reaction such as nausea, vomiting, rash, swelling, shortness  of breath. If this is the case, please stop taking the medicine immediately and contact your physician.  ?  You should return to the ER if you develop severe or worsening symptoms.   Emergency Department Resource Guide 1) Find a Doctor and Pay Out of Pocket Although you won't have to find out who is covered by your insurance plan, it is a good idea to ask around and get recommendations. You will then need to call the office and see if the doctor you have chosen will accept you as a new patient and what types of options they offer for patients who are self-pay. Some doctors offer discounts or will set up payment plans for their patients who do not have insurance, but you will need to ask so you aren't surprised when you get to your appointment.  2) Contact Your Local Health Department Not all health departments have doctors that can see patients for sick visits, but many do, so it is worth a call to see if yours does. If you don't know where your local health department is, you can check in your phone book. The CDC also has a tool to help you locate your state's health department, and many state websites also have listings of all of their local health departments.  3) Find a Walk-in Clinic If your illness is not likely to be very severe or complicated, you may want to try a walk in clinic. These are popping up  all over the country in pharmacies, drugstores, and shopping centers. They're usually staffed by nurse practitioners or physician assistants that have been trained to treat common illnesses and complaints. They're usually fairly quick and inexpensive. However, if you have serious medical issues or chronic medical problems, these are probably not your best option.  No Primary Care Doctor: Call [facility] Connect at  [phone] - they can help you locate a primary care doctor that  accepts your insurance, provides certain services, etc. Physician Referral Service- [phone]  Chronic Pain  Problems: Organization         Address  Phone   Notes  [facility]  [phone] Patients need to be referred by their primary care doctor.   Medication Assistance: Organization         Address  Phone   Notes  [facility] Medication [facility] [address] [place], [place] [address] [phone] --Must be a resident of [facility] -- Must have NO insurance coverage whatsoever (no Medicaid/ Medicare, etc.) -- The pt. MUST have a primary care doctor that directs their care regularly and follows them in the community   [facility]  [phone]   [employer]  [phone]    Agencies that provide inexpensive medical care: Organization         Address  Phone   Notes  [facility]  [phone]   [facility]    [phone]   [facility] [address] [place], [place] [address] [phone]   [facility] [address] [place] [phone]   [facility]    [phone]   [facility]    [phone]   [facility]  [address], [place] Phone:  [phone], Fax:  [phone] Hours of Operation:  9 am - 6 pm, M-F.  Also accepts Medicaid/Medicare and self-pay.  [facility]  [address], [place] Phone: [phone], Fax: [phone]. Hours of Operation:  8:30 am - 5:30 pm, M-F.  Also accepts Medicaid and self-pay.  [facility] [address], [place] Phone: [phone]   [facility] [address] Natasha Bence [place], [place] [phone], Ext. [phone] Mondays & Thursdays: 7-9 AM.  First 15 patients are seen on a first come, first serve basis.    Medicaid-accepting [facility] Providers:  Organization         Address  Phone   Notes  [facility] [address], [place] [phone] Also  accepts self-pay patients.  [facility] [address] Laurell Josephs [place], [place]  [phone]   [facility] [address], [place] [phone]   [facility] [address] [place] [phone]   Renaye Rakers [address], [place]   [phone] Only accepts [place] patients after they have their name applied to their card.   Self-Pay (no insurance) in [facility]:  Organization         Address  Phone   Notes  Sickle Cell Patients, [facility] [address] [place], [place] [phone]   [facility] [address] [place], [place] [phone]   [facility]  [address], [place] [phone]   [facility]/Dr. Osei-Bonsu  [address], [place] or [address], [place] [phone] Phone number for both [place] and [place] locations is the same.  [facility] [address] [place] [phone]   [facility] [address], [place] or [address] [phone]   [facility] [address] [place] [phone], phone; [phone], fax Sees patients 1st and 3rd Saturday of every month.  Must not qualify for public or private insurance (i.e. Medicaid, Medicare, [place] Health Choice, Veterans' Benefits)  Household income should be no more than 200% of the poverty level The clinic cannot treat you if you are pregnant or think you are pregnant  Sexually transmitted diseases are not treated at the clinic.
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
## 2018-05-05 NOTE — ED Notes (Signed)
Patient belongings inventoried by [place] and placed in locker #4-Monique,RN

## 2018-05-05 NOTE — ED Notes (Signed)
Patient transported to X-ray 

## 2018-05-05 NOTE — ED Provider Notes (Signed)
[facility]   CSN: [id number] Arrival date & time: 05/05/18  1632    History   Chief Complaint Chief Complaint  Patient presents with  . Chest Pain    HPI Bryan Kirk is a 23 y.o. male with a PMH of Anxiety, Depression, Cannabis abuse, and Schizophreniform disorder presenting with intermittent left sided nonradiating chest pain onset 2 days ago. Patient was evaluated at the urgent care yesterday for similar symptoms and prescribed omeprazole. Patient reports he has taken Tylenol and omeprazole with relief. Patient denies taking these medications today. Patient reports associated mild shortness of breath. Patient reports one episode of vomiting 2 days ago. Patient denies fever, cough, congestion, rhinorrhea, abdominal pain, nausea, or diarrhea. Patient denies sick exposures, recent travel, or known exposure to COVID-19. Patient denies recent surgery, leg edema/pain, or hx of DVT/PE. Patient reports tobacco use. Patient reports marijuana use 3-4 months ago, but states he has stopped and denies any other drug use. Patient denies alcohol use. Patient denies a personal or family history of cardiac problems.   Patient also reports anxiety and paranoia onset 2 days ago. Patient reports he has had auditory hallucinations over the last few days. Patient states he hears voices tell patient, "Leave." Patient does not recognize the voices. Patient denies visual hallucinations. Patient reports he is compliant with his medications, but forgot to take his medications for 1-2 days. Patient reports he resumed his medications today. Patient denies SI/HI.       HPI  Past Medical History:  Diagnosis Date  . Anxiety   . Depression     Patient Active Problem List   Diagnosis Date Noted  . 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 Cannabis abuse 03/13/2018  . MDD (major depressive disorder), recurrent, severe, with psychosis (HCC)  03/10/2018  . Psychosis (HCC) 12/18/2017  . 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 No pertinent surgical history.      Home Medications    Prior to Admission medications   Medication Sig Start Date End Date Taking? Authorizing Provider  ARIPiprazole (ABILIFY) 10 MG tablet Take 10 mg by mouth daily.   Yes [provider]  divalproex (DEPAKOTE) 500 MG DR tablet Take 500 mg by mouth at bedtime.    Yes [provider]  FLUoxetine (PROZAC) 20 MG capsule Take 1 capsule (20 mg total) by mouth daily. 03/14/18  Yes Malvin JohnsFarah, Brian, MD  fluticasone [facility](FLONASE) 50 MCG/ACT nasal spray Place 2 sprays into both nostrils daily. 04/16/18  Yes Yu, Amy V, PA-C  hydrOXYzine (ATARAX/VISTARIL) 50 MG tablet Take 50 mg by mouth 2 (two) times daily as needed for anxiety.    Yes [provider]  Ibuprofen-diphenhydrAMINE HCl (ADVIL PM) 200-25 MG CAPS Take 2 tablets by mouth at bedtime as needed (sleep).   Yes [provider]  omeprazole (PRILOSEC) 20 MG capsule Take 1 capsule (20 mg total) by mouth daily. 05/04/18  Yes Burky, Dorene GrebeNatalie B, NP  benztropine (COGENTIN) 0.5 MG tablet Take 1 tablet (0.5 mg total) by mouth 2 (two) times daily. Patient not taking: Reported on 05/05/2018 03/13/18   Malvin JohnsFarah, Brian, MD  brompheniramine-pseudoephedrine-DM 30-2-10 MG/5ML syrup Take 5 mLs by mouth 4 (four) times daily as needed. Patient not taking: Reported on 03/10/2018 02/07/18   Wieters, Fran LowesHallie C, PA-C  cetirizine (ZYRTEC) 10 MG tablet Take 1 tablet (10 mg total) by mouth daily. Patient not taking: Reported on 05/05/2018 04/16/18   Cathie HoopsYu,  Amy V, PA-C  Dietary Management Product (ENLYTE) CAPS Take 1 capsule by mouth daily after breakfast. Patient not taking: Reported on 05/05/2018 03/13/18   Malvin Johns, MD  ibuprofen (ADVIL,MOTRIN) 600 MG tablet Take 1 tablet (600 mg total) by mouth every 6 (six) hours as needed. Patient not taking: Reported on 05/05/2018 02/07/18    Wieters, Hallie C, PA-C  LORazepam (ATIVAN) 1 MG tablet Take 1 tablet (1 mg total) by mouth every 8 (eight) hours as needed for anxiety. Patient not taking: Reported on 05/05/2018 03/13/18   Malvin Johns, MD  prazosin (MINIPRESS) 2 MG capsule Take 1 capsule (2 mg total) by mouth at bedtime. Patient not taking: Reported on 05/05/2018 03/13/18   Malvin Johns, MD  risperiDONE (RISPERDAL) 4 MG tablet Take 1 tablet (4 mg total) by mouth at bedtime. Patient not taking: Reported on 05/05/2018 03/13/18   Malvin Johns, MD  temazepam (RESTORIL) 30 MG capsule Take 1 capsule (30 mg total) by mouth at bedtime as needed for sleep. Patient not taking: Reported on 05/05/2018 03/13/18   Malvin Johns, MD    Family History Family History  Problem Relation Age of Onset  . Healthy Mother   . Healthy Father     Social History Social History   Tobacco Use  . Smoking status: Never Smoker  . Smokeless tobacco: Never Used  Substance Use Topics  . Alcohol use: Never    Frequency: Never  . Drug use: Yes    Types: Marijuana    Comment: last use 12/13/17     Allergies   Patient has no known allergies.   Review of Systems Review of Systems  Constitutional: Negative for activity change, appetite change, chills, diaphoresis, fatigue, fever and unexpected weight change.  HENT: Negative for congestion and rhinorrhea.   Eyes: Negative for visual disturbance.  Respiratory: Positive for shortness of breath. Negative for cough, chest tightness and wheezing.   Cardiovascular: Positive for chest pain. Negative for palpitations and leg swelling.  Gastrointestinal: Negative for abdominal pain, nausea and vomiting.  Endocrine: Negative for cold intolerance and heat intolerance.  Musculoskeletal: Negative for myalgias.  Skin: Negative for rash.  Allergic/Immunologic: Negative for immunocompromised state.  Neurological: Negative for dizziness, syncope, weakness and light-headedness.  Psychiatric/Behavioral: Positive for  hallucinations. Negative for agitation, behavioral problems and suicidal ideas. The patient is nervous/anxious.     Physical Exam Updated Vital Signs BP 125/75 (BP Location: Right Arm)   Pulse 93   Temp 98.9 F (37.2 C) (Oral)   Resp 20   SpO2 98%   Physical Exam Vitals signs and nursing note reviewed.  Constitutional:      General: He is not in acute distress.    Appearance: He is well-developed. He is not diaphoretic.     Comments: Patient is sitting up in bed in no acute distress.  HENT:     Head: Normocephalic and atraumatic.  Neck:     Musculoskeletal: Normal range of motion and neck supple.     Vascular: No JVD.  Cardiovascular:     Rate and Rhythm: Regular rhythm. Tachycardia present.     Pulses: Normal pulses.          Radial pulses are 2+ on the right side and 2+ on the left side.       Dorsalis pedis pulses are 2+ on the right side and 2+ on the left side.     Heart sounds: Normal heart sounds. No murmur. No friction rub. No gallop.   Pulmonary:  Effort: Pulmonary effort is normal. No respiratory distress.     Breath sounds: Normal breath sounds. No wheezing, rhonchi or rales.     Comments: Patient is speaking in full sentences without difficulty. Oxygen saturation is 100% on room air. Chest:     Chest wall: Tenderness present.    Abdominal:     Palpations: Abdomen is soft.     Tenderness: There is no abdominal tenderness.  Musculoskeletal: Normal range of motion.     Right lower leg: He exhibits no tenderness. No edema.     Left lower leg: He exhibits no tenderness. No edema.  Skin:    Capillary Refill: Capillary refill takes less than 2 seconds.     Coloration: Skin is not pale.     Findings: No rash.  Neurological:     Mental Status: He is alert.  Psychiatric:        Attention and Perception: He is attentive. He perceives auditory hallucinations. He does not perceive visual hallucinations.        Mood and Affect: Mood is anxious.        Speech:  Speech normal.        Behavior: Behavior is not agitated, aggressive or hyperactive. Behavior is cooperative.        Thought Content: Thought content is paranoid. Thought content does not include homicidal or suicidal ideation. Thought content does not include homicidal or suicidal plan.    ED Treatments / Results  Labs (all labs ordered are listed, but only abnormal results are displayed) Labs Reviewed  CBC  TROPONIN I  D-DIMER, QUANTITATIVE (NOT AT [facility])  COMPREHENSIVE METABOLIC PANEL  ETHANOL  RAPID URINE DRUG SCREEN, HOSP PERFORMED    EKG EKG Interpretation  Date/Time:  Saturday May 05 2018 16:53:46 EDT Ventricular Rate:  107 PR Interval:    QRS Duration: 82 QT Interval:  318 QTC Calculation: 425 R Axis:   49 Text Interpretation:  Sinus tachycardia ST elev, probable normal early repol pattern When compared to prior, no significant changes seen.  No STEMI Confirmed by Theda Belfast ([id number]) on 05/05/2018 5:46:33 PM   Radiology Dg Chest 2 View  Result Date: 05/05/2018 CLINICAL DATA:  Chest pain EXAM: CHEST - 2 VIEW COMPARISON:  05/04/2018 FINDINGS: The heart size and mediastinal contours are within normal limits. Both lungs are clear. The visualized skeletal structures are unremarkable. IMPRESSION: No active cardiopulmonary disease. Electronically Signed   By: Jasmine Pang M.D.   On: 05/05/2018 18:11   Dg Chest 2 View  Result Date: 05/04/2018 CLINICAL DATA:  Chest pain. EXAM: CHEST - 2 VIEW COMPARISON:  Radiographs of September 01, 2017. FINDINGS: The heart size and mediastinal contours are within normal limits. Both lungs are clear. No pneumothorax or pleural effusion is noted. The visualized skeletal structures are unremarkable. IMPRESSION: No active cardiopulmonary disease. Electronically Signed   By: Lupita Raider M.D.   On: 05/04/2018 09:44    Procedures Procedures (including critical care time)  Medications Ordered in ED Medications  alum & mag hydroxide-simeth  (MAALOX/MYLANTA) 200-200-20 MG/5ML suspension 30 mL (30 mLs Oral Given 05/05/18 1742)  acetaminophen (TYLENOL) tablet 650 mg (650 mg Oral Given 05/05/18 1850)     Initial Impression / Assessment and Plan / ED Course  I have reviewed the triage vital signs and the nursing notes.  Pertinent labs & imaging results that were available during my care of the patient were reviewed by me and considered in my medical decision making (see chart for details).  Clinical Course as of May 04 1849  Sat May 05, 2018  1749 CBC is unremarkable.  CBC [AH]  1802 D dimer is negative.  D-dimer, quantitative [AH]  1815 No active cardiopulmonary disease noted on CXR.  DG Chest 2 View [AH]  1827 CMP without acute abnormalities.  Comprehensive metabolic panel [AH]    Clinical Course User Index [AH] Leretha Dykes, PA-C       Chest pain is not likely of cardiac or pulmonary etiology d/t presentation, D-dimer negative, VSS, no tracheal deviation, no JVD or new murmur, RRR, breath sounds equal bilaterally, EKG without acute abnormalities, negative troponin, and negative CXR. Heart score is a 1. Chest pain has resolved while in the ER. Pt has been advised to return to the ED if CP becomes exertional, associated with diaphoresis or nausea, radiates to left jaw/arm, worsens or becomes concerning in any way.   Patient also reports auditory hallucinations and paranoia. Patient has a history of psychosis and schizophreniform. Patient had a psychiatric admission on 02/2018 due to paranoia. Will consult TTS for further recommendations. Current Plan is to have patient be evaluated by TTS for further assessment on weather or not to be placed inpatient. Patient has been cleared to move to [facility] pending further assessment.   Final Clinical Impressions(s) / ED Diagnoses   Final diagnoses:  Nonspecific chest pain  Paranoia [facility])    ED Discharge Orders    None       Leretha Dykes, [place] 05/05/18 2017    Tegeler,  Canary Brim, MD 05/05/18 2350

## 2018-05-05 NOTE — Progress Notes (Signed)
TTS consulted with Nira Conn, NP who recommends continued observation for safety and stabilization and to restart pt medication. EDP Leretha Dykes, PA-C and Adonis Brook M, RN have been advised.

## 2018-05-06 ENCOUNTER — Other Ambulatory Visit: Payer: Self-pay

## 2018-05-06 DIAGNOSIS — F2081 Schizophreniform disorder: Secondary | ICD-10-CM

## 2018-05-06 NOTE — ED Notes (Signed)
Lunch tray ordered 

## 2018-05-06 NOTE — Consult Note (Signed)
[facility] ED Discharge  05/06/2018 9:25 AM Bryan Kirk  MRN:  [id number] Principal Problem: Schizophreniform disorder [facility]) Discharge Diagnoses: Principal Problem:   Schizophreniform disorder [facility])  Subjective: 23 yo male who presented to the ED with chest pain, evaluated yesterday at Urgent Care and told it was indigestion.  Same diagnosis today.  He reports he has been paranoid prior to admission and forgot to take his medications for a couple of days.  His medications were restarted in the ED and he denies suicidal/homicidal ideations, hallucinations, paranoia, and withdrawal symptoms.  He does smoke cannabis frequently which also increases his paranoia, decreases with medications.  None currently.  Reports he has medications at home and TTS Lajoyce Corners) called his mother and she did not have any safety concerns just wanted him back on his medications, she states he has medicine at home.  Stable for discharge and follow up with outpatient provider.  Total Time spent with patient: 30 minutes  Past Psychiatric History: schizophreniform d/o, cannabis abuse  Past Medical History:  Past Medical History:  Diagnosis Date  . Anxiety   . Depression    History reviewed. No pertinent surgical history. Family History:  Family History  Problem Relation Age of Onset  . Healthy Mother   . Healthy Father    Family Psychiatric  History: none Social History:  Social History   Substance and Sexual Activity  Alcohol Use Never  . Frequency: Never     Social History   Substance and Sexual Activity  Drug Use Yes  . Types: Marijuana   Comment: last use 12/13/17    Social History   Socioeconomic History  . Marital status: Single    Spouse name: Not on file  . Number of children: Not on file  . Years of education: Not on file  . Highest education level: Not on file  Occupational History  . Not on file  Social Needs  . Financial resource strain: Not on file  . Food insecurity:     Worry: Not on file    Inability: Not on file  . Transportation needs:    Medical: Not on file    Non-medical: Not on file  Tobacco Use  . Smoking status: Never Smoker  . Smokeless tobacco: Never Used  Substance and Sexual Activity  . Alcohol use: Never    Frequency: Never  . Drug use: Yes    Types: Marijuana    Comment: last use 12/13/17  . Sexual activity: Not Currently  Lifestyle  . Physical activity:    Days per week: Not on file    Minutes per session: Not on file  . Stress: Not on file  Relationships  . Social connections:    Talks on phone: Not on file    Gets together: Not on file    Attends religious service: Not on file    Active member of club or organization: Not on file    Attends meetings of clubs or organizations: Not on file    Relationship status: Not on file  Other Topics Concern  . Not on file  Social History Narrative  . Not on file    Has this patient used any form of tobacco in the last 30 days? (Cigarettes, Smokeless Tobacco, Cigars, and/or Pipes) A prescription for an FDA-approved tobacco cessation medication was offered at discharge and the patient refused  Current Medications: Current Facility-Administered Medications  Medication Dose Route Frequency Provider Last Rate Last Dose  . ARIPiprazole (ABILIFY) tablet  10 mg  10 mg Oral Daily Carlyle Basques P, PA-C   10 mg at 05/06/18 0844  . divalproex (DEPAKOTE) DR tablet 500 mg  500 mg Oral QHS Hernandez, Ana P, PA-C   500 mg at 05/05/18 2208  . FLUoxetine (PROZAC) capsule 20 mg  20 mg Oral Daily Carlyle Basques P, PA-C   20 mg at 05/06/18 0844  . fluticasone (FLONASE) 50 MCG/ACT nasal spray 2 spray  2 spray Each Nare Daily Leretha Dykes, PA-C   2 spray at 05/05/18 2209  . hydrOXYzine (ATARAX/VISTARIL) tablet 50 mg  50 mg Oral BID PRN Leretha Dykes, PA-C   50 mg at 05/05/18 2208  . pantoprazole (PROTONIX) EC tablet 40 mg  40 mg Oral Daily Carlyle Basques P, PA-C   40 mg at 05/06/18 5830   Current  Outpatient Medications  Medication Sig Dispense Refill  . ARIPiprazole (ABILIFY) 10 MG tablet Take 10 mg by mouth daily.    . divalproex (DEPAKOTE) 500 MG DR tablet Take 500 mg by mouth at bedtime.     Marland Kitchen FLUoxetine (PROZAC) 20 MG capsule Take 1 capsule (20 mg total) by mouth daily. 30 capsule 3  . fluticasone (FLONASE) 50 MCG/ACT nasal spray Place 2 sprays into both nostrils daily. 1 g 0  . hydrOXYzine (ATARAX/VISTARIL) 50 MG tablet Take 50 mg by mouth 2 (two) times daily as needed for anxiety.     . Ibuprofen-diphenhydrAMINE HCl (ADVIL PM) 200-25 MG CAPS Take 2 tablets by mouth at bedtime as needed (sleep).    Marland Kitchen omeprazole (PRILOSEC) 20 MG capsule Take 1 capsule (20 mg total) by mouth daily. 30 capsule 0  . benztropine (COGENTIN) 0.5 MG tablet Take 1 tablet (0.5 mg total) by mouth 2 (two) times daily. (Patient not taking: Reported on 05/05/2018) 60 tablet 2  . brompheniramine-pseudoephedrine-DM 30-2-10 MG/5ML syrup Take 5 mLs by mouth 4 (four) times daily as needed. (Patient not taking: Reported on 03/10/2018) 120 mL 0  . cetirizine (ZYRTEC) 10 MG tablet Take 1 tablet (10 mg total) by mouth daily. (Patient not taking: Reported on 05/05/2018) 15 tablet 0  . Dietary Management Product (ENLYTE) CAPS Take 1 capsule by mouth daily after breakfast. (Patient not taking: Reported on 05/05/2018) 30 capsule 11  . ibuprofen (ADVIL,MOTRIN) 600 MG tablet Take 1 tablet (600 mg total) by mouth every 6 (six) hours as needed. (Patient not taking: Reported on 05/05/2018) 30 tablet 0  . LORazepam (ATIVAN) 1 MG tablet Take 1 tablet (1 mg total) by mouth every 8 (eight) hours as needed for anxiety. (Patient not taking: Reported on 05/05/2018) 30 tablet 0  . prazosin (MINIPRESS) 2 MG capsule Take 1 capsule (2 mg total) by mouth at bedtime. (Patient not taking: Reported on 05/05/2018) 30 capsule 2  . risperiDONE (RISPERDAL) 4 MG tablet Take 1 tablet (4 mg total) by mouth at bedtime. (Patient not taking: Reported on 05/05/2018) 30  tablet 2  . temazepam (RESTORIL) 30 MG capsule Take 1 capsule (30 mg total) by mouth at bedtime as needed for sleep. (Patient not taking: Reported on 05/05/2018) 30 capsule 0   PTA Medications: (Not in a hospital admission)   Musculoskeletal: Strength & Muscle Tone: within normal limits Gait & Station: normal Patient leans: N/A  Psychiatric Specialty Exam: Physical Exam  Nursing note and vitals reviewed. Constitutional: He is oriented to person, place, and time. He appears well-developed and well-nourished.  HENT:  Head: Normocephalic.  Neck: Normal range of motion.  Respiratory: Effort normal.  Musculoskeletal: Normal range of motion.  Neurological: He is alert and oriented to person, place, and time.  Psychiatric: His speech is normal and behavior is normal. Judgment and thought content normal. His mood appears anxious. His affect is blunt. Cognition and memory are normal.    Review of Systems  Psychiatric/Behavioral: Positive for substance abuse. The patient is nervous/anxious.   All other systems reviewed and are negative.   Blood pressure (!) 143/86, pulse 84, temperature 98.3 F (36.8 C), temperature source Oral, resp. rate 20, SpO2 100 %.There is no height or weight on file to calculate BMI.  General Appearance: Casual  Eye Contact:  Good  Speech:  Normal Rate  Volume:  Normal  Mood:  Anxious, mild  Affect:  Blunt  Thought Process:  Coherent and Descriptions of Associations: Intact  Orientation:  Full (Time, Place, and Person)  Thought Content:  WDL and Logical  Suicidal Thoughts:  No  Homicidal Thoughts:  No  Memory:  Immediate;   Good Recent;   Good Remote;   Good  Judgement:  Fair  Insight:  Fair  Psychomotor Activity:  Normal  Concentration:  Concentration: Good and Attention Span: Good  Recall:  Good  Fund of Knowledge:  Fair  Language:  Good  Akathisia:  No  Handed:  Right  AIMS (if indicated):     Assets:  Housing Leisure Time Physical  Health Resilience Social Support  ADL's:  Intact  Cognition:  WNL  Sleep:        Demographic Factors:  Male and Adolescent or young adult  Loss Factors: NA  Historical Factors: NA  Risk Reduction Factors:   Sense of responsibility to family, Living with another person, especially a relative, Positive social support and Positive therapeutic relationship  Continued Clinical Symptoms:  Anxiety, mild  Cognitive Features That Contribute To Risk:  None    Suicide Risk:  Minimal: No identifiable suicidal ideation.  Patients presenting with no risk factors but with morbid ruminations; may be classified as minimal risk based on the severity of the depressive symptoms   Plan Of Care/Follow-up recommendations:  Schizophreniform disorder: -Continued Abilify 10 mg daily -Continued Prozac 20 mg daily -Continued Depakote 500 mg at bedtime  Anxiety: -Continued hydroxyzine 50 mg BID PRN  GERD: -Continued Protonix 40 mg daily Activity:  as tolerated Diet:  heart healthy diet  Disposition: discharge home, follow up with outpatient provider Nanine MeansJamison , NP 05/06/2018, 9:25 AM

## 2018-05-06 NOTE — ED Notes (Signed)
Patient is up wandering and pacing; pt asked to call mother but was redirect back to room-Monique,RN

## 2018-05-06 NOTE — ED Notes (Signed)
Pt aware of tx plan - d/c to home. Pt called his mother and requested for her to pick him up. Advised she is on her way.

## 2018-05-06 NOTE — ED Notes (Signed)
TTS is in room. Pt can not make anymore phone calls today.

## 2018-05-06 NOTE — ED Notes (Addendum)
Mother called and advised she has arrived to transport pt from ED. Aware pt needs to continue taking his meds. Pt voiced understanding also. ALL belongings - 1 labeled belongings bag - returned to pt. No Valuables noted. Pt signed verifying all items present.

## 2018-05-06 NOTE — BH Assessment (Signed)
Contact Patient's Mother, Glendale Openshaw [phone], for collateral information.  Ms. Herrington was agreeable to the Patient being discharged to her home.  She asked if the Patient was back on his medications for hallucinations and it was confirmed by NP.  Ms. Masaki also reports Patient has existing medication prescriptions at home.

## 2018-05-06 NOTE — ED Provider Notes (Signed)
Emergency medicine observation reevaluation note  Patient medically cleared by previous team after presenting for chest pain, during that time endorsed hallucinations and paranoia psychiatry was consulted please see their note for further details. Physical Exam  BP (!) 143/86 (BP Location: Left Arm)   Pulse 84   Temp 98.3 F (36.8 C) (Oral)   Resp 20   SpO2 100%   Physical Exam Constitutional:      General: He is not in acute distress.    Appearance: Normal appearance. He is well-developed. He is not ill-appearing or diaphoretic.  HENT:     Head: Normocephalic and atraumatic.     Right Ear: External ear normal.     Left Ear: External ear normal.     Nose: Nose normal.  Eyes:     General: Vision grossly intact. Gaze aligned appropriately.  Neck:     Musculoskeletal: Normal range of motion.     Trachea: Trachea and phonation normal. No tracheal deviation.  Pulmonary:     Effort: Pulmonary effort is normal. No respiratory distress.  Skin:    General: Skin is warm and dry.  Neurological:     Mental Status: He is alert.     GCS: GCS eye subscore is 4. GCS verbal subscore is 5. GCS motor subscore is 6.     Comments: Speech is clear and goal oriented, follows commands Major Cranial nerves without deficit, no facial droop Moves extremities without ataxia, coordination intact  Psychiatric:        Behavior: Behavior normal.     ED Course/Procedures   Clinical Course as of May 05 836  Sat May 05, 2018  1749 CBC is unremarkable.  CBC [AH]  1802 D dimer is negative.  D-dimer, quantitative [AH]  1815 No active cardiopulmonary disease noted on CXR.  DG Chest 2 View [AH]  1827 CMP without acute abnormalities.  Comprehensive metabolic panel [AH]    Clinical Course User Index [AH] Leretha Dykes, PA-C    Procedures  MDM  Review of lab work CBC within normal limits  CMP within normal limits D-dimer negative Ethanol negative Troponin negative  UDS positive for  [facility] Chest x-ray without acute findings EKG reviewed by Dr. Rush Landmark - Most recent vital signs from 6:54 AM with mild hypertension 143/86 otherwise unremarkable  Patient evaluated in his room today.  He is sitting up in bed with breakfast at bedside.  He reports that he is feeling well and has no complaints.  Patient is awaiting psychiatric reevaluation.   Note: Portions of this report may have been transcribed using voice recognition software. Every effort was made to ensure accuracy; however, inadvertent computerized transcription errors may still be present.     Bill Salinas, PA-C 05/06/18 6962    Sabas Sous, MD 05/07/18 [phone]

## 2018-05-07 ENCOUNTER — Other Ambulatory Visit: Payer: Self-pay | Admitting: Behavioral Health

## 2018-05-07 ENCOUNTER — Inpatient Hospital Stay (HOSPITAL_COMMUNITY)
Admission: AD | Admit: 2018-05-07 | Discharge: 2018-05-14 | DRG: 885 | Disposition: A | Discharge status: Home / Self Care | Payer: No Typology Code available for payment source | Source: Intra-hospital | Attending: Psychiatry | Admitting: Psychiatry

## 2018-05-07 ENCOUNTER — Emergency Department (HOSPITAL_COMMUNITY)
Admission: EM | Admit: 2018-05-07 | Discharge: 2018-05-07 | Disposition: A | Discharge status: Other Acute Inpatient Hospital | Payer: Medicaid Other | Attending: Emergency Medicine | Admitting: Emergency Medicine

## 2018-05-07 ENCOUNTER — Other Ambulatory Visit: Payer: Self-pay

## 2018-05-07 ENCOUNTER — Encounter (HOSPITAL_COMMUNITY): Payer: Self-pay

## 2018-05-07 ENCOUNTER — Encounter (HOSPITAL_COMMUNITY): Payer: Self-pay | Admitting: Emergency Medicine

## 2018-05-07 DIAGNOSIS — F419 Anxiety disorder, unspecified: Secondary | ICD-10-CM | POA: Insufficient documentation

## 2018-05-07 DIAGNOSIS — Z046 Encounter for general psychiatric examination, requested by authority: Secondary | ICD-10-CM | POA: Insufficient documentation

## 2018-05-07 DIAGNOSIS — F329 Major depressive disorder, single episode, unspecified: Secondary | ICD-10-CM | POA: Insufficient documentation

## 2018-05-07 DIAGNOSIS — K219 Gastro-esophageal reflux disease without esophagitis: Secondary | ICD-10-CM | POA: Diagnosis present

## 2018-05-07 DIAGNOSIS — F209 Schizophrenia, unspecified: Secondary | ICD-10-CM | POA: Diagnosis present

## 2018-05-07 DIAGNOSIS — R440 Auditory hallucinations: Secondary | ICD-10-CM | POA: Insufficient documentation

## 2018-05-07 DIAGNOSIS — F200 Paranoid schizophrenia: Secondary | ICD-10-CM | POA: Diagnosis not present

## 2018-05-07 DIAGNOSIS — F1220 Cannabis dependence, uncomplicated: Secondary | ICD-10-CM | POA: Diagnosis present

## 2018-05-07 DIAGNOSIS — I10 Essential (primary) hypertension: Secondary | ICD-10-CM | POA: Diagnosis present

## 2018-05-07 DIAGNOSIS — R443 Hallucinations, unspecified: Secondary | ICD-10-CM

## 2018-05-07 DIAGNOSIS — F22 Delusional disorders: Secondary | ICD-10-CM | POA: Insufficient documentation

## 2018-05-07 DIAGNOSIS — Z79899 Other long term (current) drug therapy: Secondary | ICD-10-CM | POA: Insufficient documentation

## 2018-05-07 LAB — COMPREHENSIVE METABOLIC PANEL
ALT: 25 U/L (ref 0–44)
AST: 18 U/L (ref 15–41)
Albumin: 4.4 g/dL (ref 3.5–5.0)
Alkaline Phosphatase: 69 U/L (ref 38–126)
Anion gap: 9 (ref 5–15)
BUN: 10 mg/dL (ref 6–20)
CO2: 23 mmol/L (ref 22–32)
Calcium: 9.3 mg/dL (ref 8.9–10.3)
Chloride: 106 mmol/L (ref 98–111)
Creatinine, Ser: 1.05 mg/dL (ref 0.61–1.24)
GFR calc Af Amer: >60 mL/min (ref >60)
GFR calc non Af Amer: >60 mL/min (ref >60)
Glucose, Bld: 134 mg/dL — ABNORMAL HIGH (ref 70–99)
Potassium: 4.2 mmol/L (ref 3.5–5.1)
Sodium: 138 mmol/L (ref 135–145)
Total Bilirubin: 0.6 mg/dL (ref 0.3–1.2)
Total Protein: 8.1 g/dL (ref 6.5–8.1)

## 2018-05-07 LAB — CBC WITH DIFFERENTIAL/PLATELET
Abs Immature Granulocytes: 0.03 10*3/uL (ref 0.00–0.07)
Basophils Absolute: 0 10*3/uL (ref 0.0–0.1)
Basophils Relative: 0 %
Eosinophils Absolute: 0.1 10*3/uL (ref 0.0–0.5)
Eosinophils Relative: 1 %
HCT: 48.7 % (ref 39.0–52.0)
Hemoglobin: 16.7 g/dL (ref 13.0–17.0)
Immature Granulocytes: 0 %
Lymphocytes Relative: 22 %
Lymphs Abs: 2.4 10*3/uL (ref 0.7–4.0)
MCH: 29.9 pg (ref 26.0–34.0)
MCHC: 34.3 g/dL (ref 30.0–36.0)
MCV: 87.3 fL (ref 80.0–100.0)
Monocytes Absolute: 0.6 10*3/uL (ref 0.1–1.0)
Monocytes Relative: 5 %
Neutro Abs: 7.9 10*3/uL — ABNORMAL HIGH (ref 1.7–7.7)
Neutrophils Relative %: 72 %
Platelets: 335 10*3/uL (ref 150–400)
RBC: 5.58 MIL/uL (ref 4.22–5.81)
RDW: 11.9 % (ref 11.5–15.5)
WBC: 11 10*3/uL — ABNORMAL HIGH (ref 4.0–10.5)
nRBC: 0 % (ref 0.0–0.2)

## 2018-05-07 LAB — RAPID URINE DRUG SCREEN, HOSP PERFORMED
Amphetamines: NOT DETECTED
Barbiturates: NOT DETECTED
Benzodiazepines: NOT DETECTED
Cocaine: NOT DETECTED
Opiates: NOT DETECTED
Tetrahydrocannabinol: POSITIVE — AB

## 2018-05-07 LAB — ETHANOL: Alcohol, Ethyl (B): <10 mg/dL (ref <10)

## 2018-05-07 MED ORDER — MAGNESIUM HYDROXIDE 400 MG/5ML PO SUSP: 30.0000 mL | Freq: Every day | ORAL | Status: DC | PRN

## 2018-05-07 MED ORDER — ALUM & MAG HYDROXIDE-SIMETH 200-200-20 MG/5ML PO SUSP: 30.0000 mL | ORAL | Status: DC | PRN

## 2018-05-07 MED ORDER — HYDROXYZINE HCL 50 MG PO TABS
50.0000 mg | ORAL_TABLET | Freq: Three times a day (TID) | ORAL | Status: DC | PRN
  Administered 2018-05-08 – 2018-05-12 (×5): 50 mg via ORAL
  Filled 2018-05-07 (×3): qty 1
  Filled 2018-05-07: qty 10
  Filled 2018-05-07: qty 1

## 2018-05-07 MED ORDER — DIVALPROEX SODIUM 500 MG PO DR TAB
500.0000 mg | DELAYED_RELEASE_TABLET | Freq: Every day | ORAL | Status: DC
  Administered 2018-05-08 – 2018-05-13 (×6): 500 mg via ORAL
  Filled 2018-05-07 (×3): qty 1
  Filled 2018-05-07 (×2): qty 7
  Filled 2018-05-07 (×6): qty 1

## 2018-05-07 MED ORDER — HYDROXYZINE HCL 50 MG PO TABS: 50.0000 mg | ORAL_TABLET | Freq: Two times a day (BID) | ORAL | Status: DC | PRN

## 2018-05-07 MED ORDER — FLUTICASONE PROPIONATE 50 MCG/ACT NA SUSP
2.0000 | Freq: Every day | NASAL | Status: DC
  Administered 2018-05-07: 2 via NASAL
  Filled 2018-05-07: qty 16

## 2018-05-07 MED ORDER — PANTOPRAZOLE SODIUM 40 MG PO TBEC
40.0000 mg | DELAYED_RELEASE_TABLET | Freq: Every day | ORAL | Status: DC
  Administered 2018-05-07: 40 mg via ORAL
  Filled 2018-05-07: qty 1

## 2018-05-07 MED ORDER — TRAZODONE HCL 50 MG PO TABS: 50.0000 mg | ORAL_TABLET | Freq: Every evening | ORAL | Status: DC | PRN

## 2018-05-07 MED ORDER — ACETAMINOPHEN 325 MG PO TABS
650.0000 mg | ORAL_TABLET | Freq: Four times a day (QID) | ORAL | Status: DC | PRN
  Administered 2018-05-08: 650 mg via ORAL
  Filled 2018-05-07: qty 2

## 2018-05-07 NOTE — Progress Notes (Signed)
Pt accepted to [facility], Bed 501-1 Hillery Jacks, NP is the accepting provider.  Landry Mellow, MD is the attending provider.  Call report to [phone]  [email or web address]@ [facility] ED notified.   Pt is Voluntary.  Pt may be transported by [facility]  Pt scheduled  to arrive at [facility] @5  PM   Carney Bern T. Kaylyn Lim, MSW, LCSWA Disposition Clinical Social Work [phone] (cell) [phone] (office)

## 2018-05-07 NOTE — ED Triage Notes (Signed)
Would not go into the room without his mother.the mom brought him back, pt did not want to come.

## 2018-05-07 NOTE — ED Notes (Signed)
This patient is very childlike.  He is very unsure if he wants to sign in voluntarily.  Encouraged him to call his mother and discuss it.

## 2018-05-07 NOTE — ED Notes (Signed)
Sitter at bedside.

## 2018-05-07 NOTE — ED Notes (Signed)
Pt discharged safely with [employer].  Pt had no belongings.  Pt was scared and very quiet.

## 2018-05-07 NOTE — ED Provider Notes (Signed)
[facility], patient was transferred to yellow zone pending disposition.  Please see his note for full details, but in brief Bryan Kirk is a 23 y.o. male who presents for evaluation of hallucinations.  Patient was evaluated in the hospital 2 days ago for similar and was treated with medications and hallucinations resolved.  He returns today complaining of hallucinations once again.  Denies SI or HI.  Denies substance use.  Reports patient has been taking prescription medications.  She also reports he is not sleeping.  At handoff labs, EKG and TTS consultation pending.  Plan: Follow-up on labs, EKG and TTS recommendations  Physical Exam  BP 138/79 (BP Location: Right Arm)   Pulse (!) 103   Temp 98.4 F (36.9 C) (Oral)   Resp 17   SpO2 100%   Physical Exam Vitals signs and nursing note reviewed.  Constitutional:      General: He is not in acute distress.    Appearance: He is well-developed. He is not diaphoretic.  HENT:     Head: Normocephalic and atraumatic.  Eyes:     General:        Right eye: No discharge.        Left eye: No discharge.  Pulmonary:     Effort: Pulmonary effort is normal. No respiratory distress.  Neurological:     Mental Status: He is alert.     Coordination: Coordination normal.  Psychiatric:        Attention and Perception: He perceives auditory hallucinations.        Mood and Affect: Mood normal.        Speech: Speech normal.        Behavior: Behavior is cooperative.        Thought Content: Thought content does not include homicidal or suicidal ideation.     Comments: Patient reporting auditory hallucinations, voices telling him to get up and go away, patient intermittently found trying to get up and leave her room.     ED Course/Procedures   Labs Reviewed  COMPREHENSIVE METABOLIC PANEL - Abnormal; Notable for the following components:      Result Value   Glucose, Bld 134 (*)    All other components within normal  limits  RAPID URINE DRUG SCREEN, HOSP PERFORMED - Abnormal; Notable for the following components:   Tetrahydrocannabinol POSITIVE (*)    All other components within normal limits  CBC WITH DIFFERENTIAL/PLATELET - Abnormal; Notable for the following components:   WBC 11.0 (*)    Neutro Abs 7.9 (*)    All other components within normal limits  ETHANOL   EKG Interpretation  Date/Time:  Monday May 07 2018 11:19:24 EDT Ventricular Rate:  113 PR Interval:    QRS Duration: 88 QT Interval:  304 QTC Calculation: 417 R Axis:   72 Text Interpretation:  Sinus tachycardia Consider left atrial enlargement No STEMI  Confirmed by Alona BeneLong, Joshua [phone]([id number]) on 05/07/2018 1:14:06 PM   Procedures  MDM   12:19 PM TTS performing televisit, will evaluate patient after TTS is finished.  Labs show mild leukocytosis, patient not having acute infectious symptoms, no acute electrolyte derangements and negative ethanol level.  UDS positive for THC but otherwise negative.  EKG shows sinus tachycardia with no other concerning changes.  Tachycardia is improving here in the ED.  Patient reporting frequent hallucinations where he hears voices telling him to go away, patient frequently getting up to monitor, sitter ordered for patient safety.  Awaiting  TTS recommendations.  Patient is medically cleared.  TTS has seen and evaluated patient and they recommend inpatient hospitalization for stabilization.  Patient placed under ED psych hold, home medications restarted, will defer to psychiatry for patient's psych medications.  Final diagnoses:  Hallucinations       Legrand Rams 05/07/18 1420    Maia Plan, MD 05/07/18 2025
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
## 2018-05-07 NOTE — ED Notes (Signed)
ED TO INPATIENT HANDOFF REPORT  ED Nurse Name and Phone #: Osborne CascoNadia, RN  S Name/Age/Gender Bryan Kirk 22 y.o. male Room/Bed: 041C/041C  Code Status   Code Status: Full Code  Home/SNF/Other Home Patient oriented to: self, place and situation Is this baseline? No   Triage Complete: Triage complete  Chief Complaint hearing voices; not sleeping   Triage Note Mother stated, He was discharged yesterday and hes not sleeping and hearing voices, He would not come without his mother.  Pt. Continuous hugging his mom  Not sleep in days like 6 days, he will sleep for 5 min. And will get up.  Would not go into the room without his mother.the mom brought him back, pt did not want to come.  Pt mother has all of his personal belongings. And Cell phone. Pt [facility] via [facility]C  with security . Admission pack started and handed off to accepting RN .   Allergies No Known Allergies  Level of Care/Admitting Diagnosis ED Disposition    None      B Medical/Surgery History Past Medical History:  Diagnosis Date  . Anxiety   . Depression    History reviewed. No pertinent surgical history.   A IV Location/Drains/Wounds Patient Lines/Drains/Airways Status   Active Line/Drains/Airways    None          Intake/Output Last 24 hours  Intake/Output Summary (Last 24 hours) at 05/07/2018 1443 Last data filed at 05/07/2018 1234 Gross per 24 hour  Intake -  Output 375 ml  Net -375 ml    Labs/Imaging Results for orders placed or performed during the hospital encounter of 05/07/18 (from the past 48 hour(s))  Urine rapid drug screen (hosp performed)     Status: Abnormal   Collection Time: 05/07/18  8:05 AM  Result Value Ref Range   Opiates NONE DETECTED NONE DETECTED   Cocaine NONE DETECTED NONE DETECTED   Benzodiazepines NONE DETECTED NONE DETECTED   Amphetamines NONE DETECTED NONE DETECTED   Tetrahydrocannabinol POSITIVE (A) NONE DETECTED   Barbiturates  NONE DETECTED NONE DETECTED    Comment: (NOTE) DRUG SCREEN FOR MEDICAL PURPOSES ONLY.  IF CONFIRMATION IS NEEDED FOR ANY PURPOSE, NOTIFY LAB WITHIN 5 DAYS. LOWEST DETECTABLE LIMITS FOR URINE DRUG SCREEN Drug Class                     Cutoff (ng/mL) Amphetamine and metabolites    1000 Barbiturate and metabolites    200 Benzodiazepine                 200 Tricyclics and metabolites     300 Opiates and metabolites        300 Cocaine and metabolites        300 THC                            50 Performed at [facility], [address] [place], [place] [address]   Comprehensive metabolic panel     Status: Abnormal   Collection Time: 05/07/18  8:26 AM  Result Value Ref Range   Sodium 138 135 - 145 mmol/L   Potassium 4.2 3.5 - 5.1 mmol/L   Chloride 106 98 - 111 mmol/L   CO2 23 22 - 32 mmol/L   Glucose, Bld 134 (H) 70 - 99 mg/dL   BUN 10 6 - 20 mg/dL   Creatinine, Ser 6.041.05 0.61 - 1.24  mg/dL   Calcium 9.3 8.9 - 16.1 mg/dL   Total Protein 8.1 6.5 - 8.1 g/dL   Albumin 4.4 3.5 - 5.0 g/dL   AST 18 15 - 41 U/L   ALT 25 0 - 44 U/L   Alkaline Phosphatase 69 38 - 126 U/L   Total Bilirubin 0.6 0.3 - 1.2 mg/dL   GFR calc non Af Amer >60 >60 mL/min   GFR calc Af Amer >60 >60 mL/min   Anion gap 9 5 - 15    Comment: Performed at [facility], [address] [place], [place] [address]  Ethanol     Status: None   Collection Time: 05/07/18  8:26 AM  Result Value Ref Range   Alcohol, Ethyl (B) <10 <10 mg/dL    Comment: (NOTE) Lowest detectable limit for serum alcohol is 10 mg/dL. For medical purposes only. Performed at [facility], [address], [place], [place] [address]   CBC with Diff     Status: Abnormal   Collection Time: 05/07/18  8:26 AM  Result Value Ref Range   WBC 11.0 (H) 4.0 - 10.5 K/uL   RBC 5.58 4.22 - 5.81 MIL/uL   Hemoglobin 16.7 13.0 - 17.0 g/dL   HCT 11.9 14.7 - 82.9 %   MCV 87.3 80.0 - 100.0 fL   MCH 29.9 26.0 - 34.0 pg   MCHC 34.3 30.0 - 36.0  g/dL   RDW 56.2 13.0 - 86.5 %   Platelets 335 150 - 400 K/uL   nRBC 0.0 0.0 - 0.2 %   Neutrophils Relative % 72 %   Neutro Abs 7.9 (H) 1.7 - 7.7 K/uL   Lymphocytes Relative 22 %   Lymphs Abs 2.4 0.7 - 4.0 K/uL   Monocytes Relative 5 %   Monocytes Absolute 0.6 0.1 - 1.0 K/uL   Eosinophils Relative 1 %   Eosinophils Absolute 0.1 0.0 - 0.5 K/uL   Basophils Relative 0 %   Basophils Absolute 0.0 0.0 - 0.1 K/uL   Immature Granulocytes 0 %   Abs Immature Granulocytes 0.03 0.00 - 0.07 K/uL    Comment: Performed at [facility], [address], [place], [place] [address]   Dg Chest 2 View  Result Date: 05/05/2018 CLINICAL DATA:  Chest pain EXAM: CHEST - 2 VIEW COMPARISON:  05/04/2018 FINDINGS: The heart size and mediastinal contours are within normal limits. Both lungs are clear. The visualized skeletal structures are unremarkable. IMPRESSION: No active cardiopulmonary disease. Electronically Signed   By: Jasmine Pang M.D.   On: 05/05/2018 18:11    Pending Labs Unresulted Labs (From admission, onward)   None      Vitals/Pain Today's Vitals   05/07/18 0720 05/07/18 0721 05/07/18 1200 05/07/18 1219  BP: (!) 146/95   138/79  Pulse: (!) 117   (!) 103  Resp: 18   17  Temp: 99.3 F (37.4 C)   98.4 F (36.9 C)  TempSrc: Oral   Oral  SpO2: 99%   100%  PainSc:  0-No pain 7      Isolation Precautions No active isolations  Medications Medications  pantoprazole (PROTONIX) EC tablet 40 mg (has no administration in time range)  hydrOXYzine (ATARAX/VISTARIL) tablet 50 mg (has no administration in time range)  fluticasone (FLONASE) 50 MCG/ACT nasal spray 2 spray (has no administration in time range)    Mobility walks Low fall risk   Focused Assessments Neuro Assessment Handoff:  Swallow screen pass? Yes  Neuro Assessment:   Neuro Checks:      Last Documented NIHSS Modified Score:   Has TPA been given? No If patient is a Neuro Trauma and patient is going to  OR before floor call report to 4N Charge nurse: [phone] or [phone]     R Recommendations: See Admitting Provider Note  Report given to:   Additional Notes:

## 2018-05-07 NOTE — BH Assessment (Signed)
[facility] Assessment Progress Note  Case was staffed with Lewis FNP who recommended a inpatient admission to assist with stabilization.      

## 2018-05-07 NOTE — ED Notes (Signed)
Handoff report given to [facility]; This RN will transport pt to room 49 in purple zone.

## 2018-05-07 NOTE — ED Notes (Signed)
Pt arrived to unit calm and cooperative.  He is somewhat withdrawn and soft spoken.  Sitter is with patient at this time and pt is in no distress.  He went promptly to sleep upon arrival.

## 2018-05-07 NOTE — BH Assessment (Signed)
[facility] Assessment Progress Note  1740 hours Per [facility] patient is in the process of having a IVC initiated by EDP after not wanting to sign in voluntary.

## 2018-05-07 NOTE — Progress Notes (Signed)
Patient ID: Bryan Kirk, male   DOB: [birth year], 23 y.o.   MRN: [id number]  Admission Note:  D:22 yr male who presents IVC in no acute distress for the treatment of psychosis. Pt appears flat and depressed. Pt was calm and cooperative with admission process. Pt denies SI/HI/VH , passive AH- bvetter at this time. Pt forwarded little information this evening. Pt stated he came in with clothes, but security stated he did not have anything with him when he came to [facility].   A:skin was assessed and found to be clear of any abnormal marks apart from a scar on L-calf. PT searched and no contraband found, POC and unit policies explained and understanding verbalized. Consents obtained. Food and fluids offered, and fluids accepted.  R: Pt had no additional questions or concerns.

## 2018-05-07 NOTE — ED Triage Notes (Signed)
Mother stated, He was discharged yesterday and hes not sleeping and hearing voices, He would not come without his mother.  Pt. Continuous hugging his mom  Not sleep in days like 6 days, he will sleep for 5 min. And will get up.

## 2018-05-07 NOTE — ED Triage Notes (Signed)
Pt mother has all of his personal belongings. And Cell phone. Pt transported to [facility] via W/C  with security . Admission pack started and handed off to accepting RN .

## 2018-05-07 NOTE — ED Provider Notes (Signed)
[facility] EMERGENCY DEPARTMENT Provider Note   CSN: [id number] Arrival date & time: 05/07/18  0705    History   Chief Complaint Chief Complaint  Patient presents with  . Anxiety    HPI Bryan Kirk is a 23 y.o. male.     HPI   23 year old male presents today with complaints of hallucinations.  Patient is accompanied by his mother.  Patient notes that since leaving the hospital he has had hallucinations reporting that voices "tell me to go away".  Mother notes that he is been paranoid at home reporting that her children have been murdered.  Patient denies any homicidal or suicidal ideations.  He denies any drug or alcohol use.  Mother notes that he has been taking his home prescribed medications.  She denies that he is used any drugs or alcohol.  She notes that prior to these recent episodes he has had no hallucinations.  She notes he is eating and drinking appropriately.  She reports that he is not sleeping.  Past Medical History:  Diagnosis Date  . Anxiety   . Depression     Patient Active Problem List   Diagnosis Date Noted  . 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 Cannabis abuse 03/13/2018  . Psychosis (HCC) 12/18/2017  . 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 No pertinent surgical history.      Home Medications    Prior to Admission medications   Medication Sig Start Date End Date Taking? Authorizing Provider  ARIPiprazole (ABILIFY) 10 MG tablet Take 10 mg by mouth daily.    [provider]  benztropine (COGENTIN) 0.5 MG tablet Take 1 tablet (0.5 mg total) by mouth 2 (two) times daily. Patient not taking: Reported on 05/05/2018 03/13/18   Malvin Johns, MD  cetirizine (ZYRTEC) 10 MG tablet Take 1 tablet (10 mg total) by mouth daily. Patient not taking: Reported on 05/05/2018 04/16/18   Belinda Fisher, PA-C  divalproex (DEPAKOTE) 500 MG DR tablet Take 500 mg by mouth at  bedtime.     [provider]  FLUoxetine (PROZAC) 20 MG capsule Take 1 capsule (20 mg total) by mouth daily. 03/14/18   Malvin Johns, MD  fluticasone (FLONASE) 50 MCG/ACT nasal spray Place 2 sprays into both nostrils daily. 04/16/18   Cathie Hoops, Amy V, PA-C  hydrOXYzine (ATARAX/VISTARIL) 50 MG tablet Take 50 mg by mouth 2 (two) times daily as needed for anxiety.     [provider]  Ibuprofen-diphenhydrAMINE HCl (ADVIL PM) 200-25 MG CAPS Take 2 tablets by mouth at bedtime as needed (sleep).    [provider]  LORazepam (ATIVAN) 1 MG tablet Take 1 tablet (1 mg total) by mouth every 8 (eight) hours as needed for anxiety. Patient not taking: Reported on 05/05/2018 03/13/18   Malvin Johns, MD  omeprazole (PRILOSEC) 20 MG capsule Take 1 capsule (20 mg total) by mouth daily. 05/04/18   Georgetta Haber, NP    Family History Family History  Problem Relation Age of Onset  . Healthy Mother   . Healthy Father     Social History Social History   Tobacco Use  . Smoking status: Never Smoker  . Smokeless tobacco: Never Used  Substance Use Topics  . Alcohol use: Never    Frequency: Never  . Drug use: Yes    Types: Marijuana    Comment: last use 12/13/17     Allergies   Patient has no known allergies.  Review of Systems Review of Systems  All other systems reviewed and are negative.    Physical Exam Updated Vital Signs BP (!) 146/95 (BP Location: Right Arm)   Pulse (!) 117   Temp 99.3 F (37.4 C) (Oral)   Resp 18   SpO2 99%   Physical Exam Vitals signs and nursing note reviewed.  Constitutional:      Appearance: He is well-developed.  HENT:     Head: Normocephalic and atraumatic.  Eyes:     General: No scleral icterus.       Right eye: No discharge.        Left eye: No discharge.     Conjunctiva/sclera: Conjunctivae normal.     Pupils: Pupils are equal, round, and reactive to light.  Neck:     Musculoskeletal: Normal range of motion.     Vascular: No JVD.      Trachea: No tracheal deviation.  Pulmonary:     Effort: Pulmonary effort is normal.     Breath sounds: No stridor.  Neurological:     Mental Status: He is alert and oriented to person, place, and time.     Coordination: Coordination normal.  Psychiatric:        Behavior: Behavior normal.        Thought Content: Thought content normal.        Judgment: Judgment normal.      ED Treatments / Results  Labs (all labs ordered are listed, but only abnormal results are displayed) Labs Reviewed  COMPREHENSIVE METABOLIC PANEL - Abnormal; Notable for the following components:      Result Value   Glucose, Bld 134 (*)    All other components within normal limits  RAPID URINE DRUG SCREEN, HOSP PERFORMED - Abnormal; Notable for the following components:   Tetrahydrocannabinol POSITIVE (*)    All other components within normal limits  CBC WITH DIFFERENTIAL/PLATELET - Abnormal; Notable for the following components:   WBC 11.0 (*)    Neutro Abs 7.9 (*)    All other components within normal limits  ETHANOL    EKG None  Radiology Dg Chest 2 View  Result Date: 05/05/2018 CLINICAL DATA:  Chest pain EXAM: CHEST - 2 VIEW COMPARISON:  05/04/2018 FINDINGS: The heart size and mediastinal contours are within normal limits. Both lungs are clear. The visualized skeletal structures are unremarkable. IMPRESSION: No active cardiopulmonary disease. Electronically Signed   By: Jasmine Pang M.D.   On: 05/05/2018 18:11    Procedures Procedures (including critical care time)  Medications Ordered in ED Medications - No data to display   Initial Impression / Assessment and Plan / ED Course  I have reviewed the triage vital signs and the nursing notes.  Pertinent labs & imaging results that were available during my care of the patient were reviewed by me and considered in my medical decision making (see chart for details).        23 year old male with hallucinations.  Question if he is  actually taking his medications as when he was last hospitalized his hallucinations resolved when getting his medications.  Patient also not truthful with drug use history.  He notes he has not smoked marijuana in approximately 2 months, his urine is positive for THC presently.  Patient is medically cleared awaiting TTS evaluation.  Final Clinical Impressions(s) / ED Diagnoses   Final diagnoses:  Hallucinations    ED Discharge Orders    None       Eyvonne Mechanic, Cordelia Poche 05/07/18  1134    Maia PlanLong, Joshua G, MD 05/07/18 2025

## 2018-05-07 NOTE — Tx Team (Signed)
Initial Treatment Plan 05/07/2018 10:21 PM Bryan Kirk MCE:[id number]    PATIENT STRESSORS: Health problems Medication change or noncompliance   PATIENT STRENGTHS: General fund of knowledge Motivation for treatment/growth   PATIENT IDENTIFIED PROBLEMS:  psychosis  " I don't really know, get more sleep, I don't know why I'm here                   DISCHARGE CRITERIA:  Adequate post-discharge living arrangements Improved stabilization in mood, thinking, and/or behavior Verbal commitment to aftercare and medication compliance  PRELIMINARY DISCHARGE PLAN: Attend aftercare/continuing care group Attend PHP/IOP Outpatient therapy  PATIENT/FAMILY INVOLVEMENT: This treatment plan has been presented to and reviewed with the patient, Bryan Kirk.  The patient and family have been given the opportunity to ask questions and make suggestions.  Delos Haring, RN 05/07/2018, 10:21 PM

## 2018-05-08 DIAGNOSIS — F200 Paranoid schizophrenia: Secondary | ICD-10-CM

## 2018-05-08 LAB — LIPID PANEL
Cholesterol: 154 mg/dL (ref 0–200)
HDL: 28 mg/dL — ABNORMAL LOW (ref >40)
LDL Cholesterol: 115 mg/dL — ABNORMAL HIGH (ref 0–99)
Total CHOL/HDL Ratio: 5.5 RATIO
Triglycerides: 55 mg/dL (ref <150)
VLDL: 11 mg/dL (ref 0–40)

## 2018-05-08 LAB — HEMOGLOBIN A1C
Hgb A1c MFr Bld: 4.7 % — ABNORMAL LOW (ref 4.8–5.6)
Mean Plasma Glucose: 88.19 mg/dL

## 2018-05-08 MED ORDER — TEMAZEPAM 30 MG PO CAPS
30.0000 mg | ORAL_CAPSULE | Freq: Every day | ORAL | Status: DC
  Administered 2018-05-08 – 2018-05-11 (×4): 30 mg via ORAL
  Filled 2018-05-08 (×4): qty 1

## 2018-05-08 MED ORDER — OMEGA-3-ACID ETHYL ESTERS 1 G PO CAPS
1.0000 g | ORAL_CAPSULE | Freq: Two times a day (BID) | ORAL | Status: DC
  Administered 2018-05-08 – 2018-05-13 (×8): 1 g via ORAL
  Filled 2018-05-08 (×5): qty 1
  Filled 2018-05-08: qty 14
  Filled 2018-05-08: qty 1
  Filled 2018-05-08: qty 14
  Filled 2018-05-08 (×4): qty 1
  Filled 2018-05-08: qty 14
  Filled 2018-05-08: qty 1
  Filled 2018-05-08: qty 14
  Filled 2018-05-08 (×4): qty 1

## 2018-05-08 MED ORDER — RISPERIDONE 3 MG PO TABS
3.0000 mg | ORAL_TABLET | Freq: Two times a day (BID) | ORAL | Status: DC
  Administered 2018-05-08 – 2018-05-10 (×4): 3 mg via ORAL
  Filled 2018-05-08 (×7): qty 1

## 2018-05-08 MED ORDER — BENZTROPINE MESYLATE 0.5 MG PO TABS
0.5000 mg | ORAL_TABLET | Freq: Two times a day (BID) | ORAL | Status: DC
  Administered 2018-05-08 – 2018-05-10 (×4): 0.5 mg via ORAL
  Filled 2018-05-08 (×7): qty 1

## 2018-05-08 MED ORDER — PROPRANOLOL HCL 10 MG PO TABS
10.0000 mg | ORAL_TABLET | Freq: Two times a day (BID) | ORAL | Status: DC
  Administered 2018-05-08 – 2018-05-09 (×2): 10 mg via ORAL
  Filled 2018-05-08 (×5): qty 1

## 2018-05-08 MED ORDER — LORAZEPAM 2 MG/ML IJ SOLN: 2.0000 mg | INTRAMUSCULAR | Status: DC | PRN

## 2018-05-08 MED ORDER — OLANZAPINE 10 MG PO TABS
10.0000 mg | ORAL_TABLET | Freq: Once | ORAL | Status: AC
  Administered 2018-05-08: 05:00:00 10 mg via ORAL
  Filled 2018-05-08 (×2): qty 1

## 2018-05-08 MED ORDER — LORAZEPAM 1 MG PO TABS
2.0000 mg | ORAL_TABLET | Freq: Four times a day (QID) | ORAL | Status: DC | PRN
  Administered 2018-05-12: 21:00:00 2 mg via ORAL

## 2018-05-08 NOTE — Progress Notes (Signed)
Patient ID: Bryan Kirk, male   DOB: Jan 04, 1996, 23 y.o.   MRN: [id number]  [place] NOVEL CORONAVIRUS (COVID-19) DAILY CHECK-OFF SYMPTOMS - answer yes or no to each - every day NO YES  Have you had a fever in the past 24 hours?  . Fever (Temp > 37.80C / 100F) X   Have you had any of these symptoms in the past 24 hours? . New Cough .  Sore Throat  .  Shortness of Breath .  Difficulty Breathing .  Unexplained Body Aches   X   Have you had any one of these symptoms in the past 24 hours not related to allergies?   . Runny Nose .  Nasal Congestion .  Sneezing   X   If you have had runny nose, nasal congestion, sneezing in the past 24 hours, has it worsened?  X   EXPOSURES - check yes or no X   Have you traveled outside the state in the past 14 days?  X   Have you been in contact with someone with a confirmed diagnosis of COVID-19 or PUI in the past 14 days without wearing appropriate PPE?  X   Have you been living in the same home as a person with confirmed diagnosis of COVID-19 or a PUI (household contact)?    X   Have you been diagnosed with COVID-19?    X              What to do next: Answered NO to all: Answered YES to anything:   Proceed with unit schedule Follow the [facility] Inpatient Flowsheet.

## 2018-05-08 NOTE — Progress Notes (Signed)
Patient did not attend wrap up group because he was asleep. 

## 2018-05-08 NOTE — Progress Notes (Signed)
Pt appeared very paranoid about going in the room with his roommate.

## 2018-05-08 NOTE — Progress Notes (Signed)
Recreation Therapy Notes  Date: 4.28.20 Time: 1000 Location: 500 Hall Dayroom  Group Topic: Triggers  Goal Area(s) Addresses:  Patient will identify biggest triggers. Patient will identify how triggers can be avoided. Patient will identify how to face triggers head on.  Intervention: Worksheet  Activity:  Triggers.  Patient was to identify their 3 biggest triggers, how they avoid/reduse exposure to triggers and how they deal with them head on.  Education: Triggers, Discharge Planning  Education Outcome: Acknowledges understanding/In group clarification offered/Needs additional education.   Clinical Observations/Feedback:  Pt did not attend group.    Caroll Rancher, LRT/CTRS    Lillia Abed, Siniyah Evangelist A 05/08/2018 11:59 AM

## 2018-05-08 NOTE — Progress Notes (Signed)
D: Pt denies SI/HI/AVH. Pt isolated to his room much of the evening. Pt appeared to be responding and continues to be paranoid.   A: Pt was offered support and encouragement. Pt was given scheduled medications. Pt was encourage to attend groups. Q 15 minute checks were done for safety.   R:Pt attends groups and interacts well with peers and staff. Pt is taking medication. Pt has no complaints.Pt receptive to treatment and safety maintained on unit.   Problem: Activity: Goal: Sleeping patterns will improve Outcome: Progressing

## 2018-05-08 NOTE — Progress Notes (Signed)
Pt wandering the hall, pt trying to go in other patients room, pt appears to be afraid to go into the room with the roommate in there.

## 2018-05-08 NOTE — Progress Notes (Signed)
Pt up to the nursing station , stating he could hear the "helicopters". Pt stated he did not feel safe in the room . Pt delusional and paranoid thinking people were watching him through the window. Per Barbara Cower pt was ordered 10 mg Zyprexa.
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
## 2018-05-08 NOTE — Plan of Care (Signed)
  Problem: Activity: Goal: Sleeping patterns will improve Outcome: Progressing   Problem: Activity: Goal: Interest or engagement in activities will improve Outcome: Not Progressing   

## 2018-05-08 NOTE — H&P (Addendum)
Psychiatric Admission Assessment Adult  Patient Identification: Bryan Kirk MRN:  [id number] Date of Evaluation:  05/08/2018 Chief Complaint:  schiziphrenia Principal Diagnosis: Recurrence of pathology regarding underlying psychotic disorder Diagnosis:  Active Problems:   Schizophrenia (HCC)  History of Present Illness:  Bryan Kirk is 23 years of age he is known to the service- was last admitted on 3/1 of this year, at that point in time it was his first admission- he is a long-term cannabis dependent patient who had recently declined as far as functioning and he began having psychotic symptoms- during that stay was diagnosed with a schizophreniform type condition. He was discharged on a combination of risperidone 4 mg at night prazosin for nightmares temazepam for insomnia benztropine as well.  Patient re-presented on 4/24 complaining of chest pain through the emergency department and thus he was monitored overnight -by 4/25 was described as paranoid and requiring redirection from staff- By 4/26 he continued to endorse paranoid thoughts that were somewhat vague and acknowledge that he had not taken his psychotropic medications for couple of days and acknowledge the cannabis dependency which increased his paranoia.  Thus he was discharged on 4/26 but by 4/27 mother brought him back to emergency department he had not slept he was still paranoid and hearing voices.  He further elaborated he was indeed hearing voices telling him to leave and do other things he did not recognize the voices he denied visual hallucinations. Along the unit last night he complained of hearing helicopters quite frequent redirection thinking people are watching him and going in and out of other people's rooms.  He has no involuntary movements he denies head trauma the substance abuse continues as mentioned when I interview him he is extremely vague to stating he is here for rest and will not give me very much  meaningful information again vague evasive negative symptoms prominent.  See mental status exam below Associated Signs/Symptoms: Depression Symptoms:  insomnia (Hypo) Manic Symptoms:  Hallucinations, Anxiety Symptoms:  Excessive Worry, Psychotic Symptoms:  Paranoia, PTSD Symptoms: Had a traumatic exposure:  Discussed previously traumatic events as child Total Time spent with patient: 45 minutes  Past Psychiatric History: See old chart was last here 2 months ago with a similar presentation  Is the patient at risk to self? Yes.    Has the patient been a risk to self in the past 6 months? Yes.    Has the patient been a risk to self within the distant past? No.  Is the patient a risk to others? No.  Has the patient been a risk to others in the past 6 months? No.  Has the patient been a risk to others within the distant past? No.   Alcohol Screening: 1. How often do you have a drink containing alcohol?: Never 2. How many drinks containing alcohol do you have on a typical day when you are drinking?: 1 or 2 3. How often do you have six or more drinks on one occasion?: Never AUDIT-C Score: 0 9. Have you or someone else been injured as a result of your drinking?: No 10. Has a relative or friend or a doctor or another health worker been concerned about your drinking or suggested you cut down?: No Alcohol Use Disorder Identification Test Final Score (AUDIT): 0 Alcohol Brief Interventions/Follow-up: AUDIT Score <7 follow-up not indicated Substance Abuse History in the last 12 months:  Yes.   Consequences of Substance Abuse: Medical Consequences:  Believe his cannabis dependency may have  induced his psychosis Previous Psychotropic Medications: Yes  Psychological Evaluations: No  Past Medical History:  Past Medical History:  Diagnosis Date  . Anxiety   . Depression    History reviewed. No pertinent surgical history. Family History:  Family History  Problem Relation Age of Onset  . Healthy  Mother   . Healthy Father     Tobacco Screening: Have you used any form of tobacco in the last 30 days? (Cigarettes, Smokeless Tobacco, Cigars, and/or Pipes): No Social History:  Social History   Substance and Sexual Activity  Alcohol Use Never  . Frequency: Never     Social History   Substance and Sexual Activity  Drug Use Yes  . Types: Marijuana   Comment: last use 12/13/17    Additional Social History:                           Allergies:  No Known Allergies Lab Results:  Results for orders placed or performed during the hospital encounter of 05/07/18 (from the past 48 hour(s))  Urine rapid drug screen (hosp performed)     Status: Abnormal   Collection Time: 05/07/18  8:05 AM  Result Value Ref Range   Opiates NONE DETECTED NONE DETECTED   Cocaine NONE DETECTED NONE DETECTED   Benzodiazepines NONE DETECTED NONE DETECTED   Amphetamines NONE DETECTED NONE DETECTED   Tetrahydrocannabinol POSITIVE (A) NONE DETECTED   Barbiturates NONE DETECTED NONE DETECTED    Comment: (NOTE) DRUG SCREEN FOR MEDICAL PURPOSES ONLY.  IF CONFIRMATION IS NEEDED FOR ANY PURPOSE, NOTIFY LAB WITHIN 5 DAYS. LOWEST DETECTABLE LIMITS FOR URINE DRUG SCREEN Drug Class                     Cutoff (ng/mL) Amphetamine and metabolites    1000 Barbiturate and metabolites    200 Benzodiazepine                 200 Tricyclics and metabolites     300 Opiates and metabolites        300 Cocaine and metabolites        300 THC                            50 Performed at [facility], [address] [place], [place] [address]   Comprehensive metabolic panel     Status: Abnormal   Collection Time: 05/07/18  8:26 AM  Result Value Ref Range   Sodium 138 135 - 145 mmol/L   Potassium 4.2 3.5 - 5.1 mmol/L   Chloride 106 98 - 111 mmol/L   CO2 23 22 - 32 mmol/L   Glucose, Bld 134 (H) 70 - 99 mg/dL   BUN 10 6 - 20 mg/dL   Creatinine, Ser 7.821.05 0.61 - 1.24 mg/dL   Calcium 9.3 8.9 - 95.610.3 mg/dL    Total Protein 8.1 6.5 - 8.1 g/dL   Albumin 4.4 3.5 - 5.0 g/dL   AST 18 15 - 41 U/L   ALT 25 0 - 44 U/L   Alkaline Phosphatase 69 38 - 126 U/L   Total Bilirubin 0.6 0.3 - 1.2 mg/dL   GFR calc non Af Amer >60 >60 mL/min   GFR calc Af Amer >60 >60 mL/min   Anion gap 9 5 - 15    Comment: Performed at [facility], [address] [place], [place] [address]  Ethanol     Status: None   Collection Time: 05/07/18  8:26 AM  Result Value Ref Range   Alcohol, Ethyl (B) <10 <10 mg/dL    Comment: (NOTE) Lowest detectable limit for serum alcohol is 10 mg/dL. For medical purposes only. Performed at [facility], [address] [place], [place] [address]   CBC with Diff     Status: Abnormal   Collection Time: 05/07/18  8:26 AM  Result Value Ref Range   WBC 11.0 (H) 4.0 - 10.5 K/uL   RBC 5.58 4.22 - 5.81 MIL/uL   Hemoglobin 16.7 13.0 - 17.0 g/dL   HCT 60.4 54.0 - 98.1 %   MCV 87.3 80.0 - 100.0 fL   MCH 29.9 26.0 - 34.0 pg   MCHC 34.3 30.0 - 36.0 g/dL   RDW 19.1 47.8 - 29.5 %   Platelets 335 150 - 400 K/uL   nRBC 0.0 0.0 - 0.2 %   Neutrophils Relative % 72 %   Neutro Abs 7.9 (H) 1.7 - 7.7 K/uL   Lymphocytes Relative 22 %   Lymphs Abs 2.4 0.7 - 4.0 K/uL   Monocytes Relative 5 %   Monocytes Absolute 0.6 0.1 - 1.0 K/uL   Eosinophils Relative 1 %   Eosinophils Absolute 0.1 0.0 - 0.5 K/uL   Basophils Relative 0 %   Basophils Absolute 0.0 0.0 - 0.1 K/uL   Immature Granulocytes 0 %   Abs Immature Granulocytes 0.03 0.00 - 0.07 K/uL    Comment: Performed at [facility], [address], [place], [place] [address]    Blood Alcohol level:  Lab Results  Component Value Date   [facility] <10 05/07/2018   ETH <10 05/05/2018    Metabolic Disorder Labs:  Lab Results  Component Value Date   HGBA1C 4.7 (L) 03/11/2018   MPG 88.19 03/11/2018   Lab Results  Component Value Date   PROLACTIN 34.9 (H) 03/11/2018   Lab Results  Component Value Date   CHOL 165 03/11/2018   TRIG 34  03/11/2018   HDL 37 (L) 03/11/2018   CHOLHDL 4.5 03/11/2018   VLDL 7 03/11/2018   LDLCALC 121 (H) 03/11/2018    Current Medications: Current Facility-Administered Medications  Medication Dose Route Frequency Provider Last Rate Last Dose  . acetaminophen (TYLENOL) tablet 650 mg  650 mg Oral Q6H PRN Denzil Magnuson, NP   650 mg at 05/08/18 0532  . alum & mag hydroxide-simeth (MAALOX/MYLANTA) 200-200-20 MG/5ML suspension 30 mL  30 mL Oral Q4H PRN Denzil Magnuson, NP      . divalproex (DEPAKOTE) DR tablet 500 mg  500 mg Oral QHS Nira Conn A, NP   500 mg at 05/08/18 0430  . hydrOXYzine (ATARAX/VISTARIL) tablet 50 mg  50 mg Oral TID PRN Jackelyn Poling, NP   50 mg at 05/08/18 0429  . magnesium hydroxide (MILK OF MAGNESIA) suspension 30 mL  30 mL Oral Daily PRN Denzil Magnuson, NP      . traZODone (DESYREL) tablet 50 mg  50 mg Oral QHS PRN Denzil Magnuson, NP       PTA Medications: Medications Prior to Admission  Medication Sig Dispense Refill Last Dose  . ARIPiprazole (ABILIFY) 10 MG tablet Take 10 mg by mouth daily.   05/06/2018 at Unknown time  . benztropine (COGENTIN) 0.5 MG tablet Take 1 tablet (0.5 mg total) by mouth 2 (two) times daily. (Patient not taking: Reported on 05/07/2018) 60 tablet 2 Not Taking at Unknown time  .  cetirizine (ZYRTEC) 10 MG tablet Take 1 tablet (10 mg total) by mouth daily. (Patient not taking: Reported on 05/05/2018) 15 tablet 0 Not Taking at Unknown time  . divalproex (DEPAKOTE) 500 MG DR tablet Take 500 mg by mouth at bedtime.    05/06/2018 at Unknown time  . FLUoxetine (PROZAC) 20 MG capsule Take 1 capsule (20 mg total) by mouth daily. 30 capsule 3 05/06/2018 at Unknown time  . fluticasone (FLONASE) 50 MCG/ACT nasal spray Place 2 sprays into both nostrils daily. (Patient taking differently: Place 2 sprays into both nostrils daily as needed for allergies or rhinitis. ) 1 g 0 Past Week at Unknown time  . hydrOXYzine (ATARAX/VISTARIL) 50 MG tablet Take 50 mg by  mouth 2 (two) times daily as needed for anxiety.    05/06/2018 at Unknown time  . Ibuprofen-diphenhydrAMINE HCl (ADVIL PM) 200-25 MG CAPS Take 2 tablets by mouth at bedtime as needed (sleep).   05/06/2018 at Unknown time  . LORazepam (ATIVAN) 1 MG tablet Take 1 tablet (1 mg total) by mouth every 8 (eight) hours as needed for anxiety. (Patient not taking: Reported on 05/07/2018) 30 tablet 0 Not Taking at Unknown time  . omeprazole (PRILOSEC) 20 MG capsule Take 1 capsule (20 mg total) by mouth daily. (Patient taking differently: Take 20 mg by mouth daily as needed (acid reflux). ) 30 capsule 0 Past Week at Unknown time    Musculoskeletal: Strength & Muscle Tone: within normal limits Gait & Station: normal Patient leans: N/A  Psychiatric Specialty Exam: Physical Exam his blood pressure is up he tells me it is not normally up he has no involuntary movements  ROS on neurological review of systems denies seizures denies head trauma for endocrine review of systems denies any issues with thyroid for cardiac review of systems reports recent chest pain but states now maybe he was just nervous  Blood pressure (!) 147/94, pulse 97, temperature 98 F (36.7 C), temperature source Oral, resp. rate 18, height  (1.651 m), weight 92.5 kg.Body mass index is 33.95 kg/m.  General Appearance: Disheveled  Eye Contact:  Minimal  Speech:  Slow  Volume:  Decreased  Mood:  Dysphoric  Affect:  Blunt  Thought Process:  Disorganized and Irrelevant  Orientation:  Full (Time, Place, and Person)  Thought Content:  Illogical, Hallucinations: Auditory, Paranoid Ideation and Tangential  Suicidal Thoughts:  denies  Homicidal Thoughts:  No  Memory:  Immediate;   Fair  Judgement:  Impaired  Insight:  Lacking  Psychomotor Activity:  Decreased  Concentration:  Concentration: Fair  Recall:  [employer] of Knowledge:  Fair  Language:  Good  Akathisia:  NA  Handed:  Right  AIMS (if indicated):     Assets:  Communication  Skills  ADL's:  Intact  Cognition:  WNL  Sleep:  Number of Hours: 3.25    Treatment Plan Summary: Daily contact with patient to assess and evaluate symptoms and progress in treatment, Medication management and Plan For diagnostic clarity and treatment  Observation Level/Precautions:  15 minute checks  Laboratory:  UDS  Psychotherapy: Reality based  Medications: Resume Risperdal that worked last time  Consultations: Not necessary  Discharge Concerns: Longer-term compliance  Estimated LOS: 5-7  Other: Axis I schizophreniform disorder/cannabis dependence   Physician Treatment Plan for Primary Diagnosis: <principal problem not specified> Long Term Goal(s): Improvement in symptoms so as ready for discharge  Short Term Goals: Ability to verbalize feelings will improve  Physician Treatment Plan for Secondary Diagnosis:  Active Problems:   Schizophrenia (HCC)  Long Term Goal(s): Improvement in symptoms so as ready for discharge  Short Term Goals: Compliance with prescribed medications will improve  I certify that inpatient services furnished can reasonably be expected to improve the patient's condition.    Malvin Johns, MD 4/28/20202:15 PM

## 2018-05-09 MED ORDER — PROPRANOLOL HCL 40 MG PO TABS
40.0000 mg | ORAL_TABLET | Freq: Two times a day (BID) | ORAL | Status: DC
  Administered 2018-05-09 – 2018-05-10 (×2): 40 mg via ORAL
  Filled 2018-05-09 (×4): qty 1

## 2018-05-09 NOTE — Progress Notes (Signed)
Recreation Therapy Notes  Date: 4.29.20 Time: 1000 Location: 500 Hall Dayroom  Group Topic: Wellness  Goal Area(s) Addresses:  Patient will define components of whole wellness. Patient will verbalize benefit of whole wellness.  Behavioral Response:  None  Intervention:  Exercise, Music   Activity:  Exercise.  LRT lead group in a series of stretches to loosen them up before going into the exercises.  Each patient got the opportunity to lead the group in an exercise of their choice.  The group was to get at least 30 minutes of exercise.  Patients could take water breaks/breaks as needed.  Education: Wellness, Building control surveyor.   Education Outcome: Acknowledges education/In group clarification offered/Needs additional education.   Clinical Observations/Feedback:  Pt came to group late.  Pt sat in group for a few minutes before leaving and not returning.    Caroll Rancher, LRT/CTRS     Lillia Abed, Feliciana Narayan A 05/09/2018 11:13 AM

## 2018-05-09 NOTE — Progress Notes (Signed)
Patient ID: Bryan Kirk, male   DOB: 06/05/1995, 22 y.o.   MRN: [id number]  Agenda NOVEL CORONAVIRUS (COVID-19) DAILY CHECK-OFF SYMPTOMS - answer yes or no to each - every day NO YES  Have you had a fever in the past 24 hours?  . Fever (Temp > 37.80C / 100F) X   Have you had any of these symptoms in the past 24 hours? . New Cough .  Sore Throat  .  Shortness of Breath .  Difficulty Breathing .  Unexplained Body Aches   X   Have you had any one of these symptoms in the past 24 hours not related to allergies?   . Runny Nose .  Nasal Congestion .  Sneezing   X   If you have had runny nose, nasal congestion, sneezing in the past 24 hours, has it worsened?  X   EXPOSURES - check yes or no X   Have you traveled outside the state in the past 14 days?  X   Have you been in contact with someone with a confirmed diagnosis of COVID-19 or PUI in the past 14 days without wearing appropriate PPE?  X   Have you been living in the same home as a person with confirmed diagnosis of COVID-19 or a PUI (household contact)?    X   Have you been diagnosed with COVID-19?    X              What to do next: Answered NO to all: Answered YES to anything:   Proceed with unit schedule Follow the [facility] Inpatient Flowsheet.    

## 2018-05-09 NOTE — Progress Notes (Signed)
Patient ID: Bryan Kirk, male   DOB: 1995-07-10, 23 y.o.   MRN: [id number] D) Pt has been seclusive to self. Isolative to his room. Pt affect blank, anxious. Mood anxious. Pt anxious about talking to his mother but appeared to be less anxious after speaking with her. Pt has been  Compliant with all medications. Pt denies avh but appears to be responding to internal stimuli. A) Level 3 obs for safety. Support, encouragement provided. Med ed reinforced. Reassurance given. R) Guarded.

## 2018-05-09 NOTE — Progress Notes (Signed)
[name] NOVEL CORONAVIRUS (COVID-19) DAILY CHECK-OFF SYMPTOMS - answer yes or no to each - every day NO YES  Have you had a fever in the past 24 hours?  . Fever (Temp > 37.80C / 100F) X   Have you had any of these symptoms in the past 24 hours? . New Cough .  Sore Throat  .  Shortness of Breath .  Difficulty Breathing .  Unexplained Body Aches   X   Have you had any one of these symptoms in the past 24 hours not related to allergies?   . Runny Nose .  Nasal Congestion .  Sneezing   X   If you have had runny nose, nasal congestion, sneezing in the past 24 hours, has it worsened?  X   EXPOSURES - check yes or no X   Have you traveled outside the state in the past 14 days?  X   Have you been in contact with someone with a confirmed diagnosis of COVID-19 or PUI in the past 14 days without wearing appropriate PPE?  X   Have you been living in the same home as a person with confirmed diagnosis of COVID-19 or a PUI (household contact)?    X   Have you been diagnosed with COVID-19?    X              What to do next: Answered NO to all: Answered YES to anything:   Proceed with unit schedule Follow the [facility] Inpatient Flowsheet.   

## 2018-05-09 NOTE — Progress Notes (Signed)
Coty did not attend wrap-up group. Pt denies SI/HI/AVH/Pain at this time. Pt appears paranoid/isolative in affect and mood. Pt stayed in room for majority of shift. Pt was guarded/cautious with interaction. Support offered. Will continue with POC.

## 2018-05-09 NOTE — Tx Team (Signed)
Interdisciplinary Treatment and Diagnostic Plan Update  05/09/2018 Time of Session: 9:00am  Bryan Kirk MRN: [id number]  Principal Diagnosis: <principal problem not specified>  Secondary Diagnoses: Active Problems:   Schizophrenia (HCC)   Current Medications:  Current Facility-Administered Medications  Medication Dose Route Frequency Provider Last Rate Last Dose  . acetaminophen (TYLENOL) tablet 650 mg  650 mg Oral Q6H PRN Denzil Magnuson, NP   650 mg at 05/08/18 0532  . alum & mag hydroxide-simeth (MAALOX/MYLANTA) 200-200-20 MG/5ML suspension 30 mL  30 mL Oral Q4H PRN Denzil Magnuson, NP      . benztropine (COGENTIN) tablet 0.5 mg  0.5 mg Oral BID Malvin Johns, MD   0.5 mg at 05/09/18 7829  . divalproex (DEPAKOTE) DR tablet 500 mg  500 mg Oral QHS Nira Conn A, NP   500 mg at 05/08/18 2107  . hydrOXYzine (ATARAX/VISTARIL) tablet 50 mg  50 mg Oral TID PRN Jackelyn Poling, NP   50 mg at 05/08/18 2107  . LORazepam (ATIVAN) tablet 2 mg  2 mg Oral Q6H PRN Malvin Johns, MD       Or  . LORazepam (ATIVAN) injection 2 mg  2 mg Intramuscular Q4H PRN Malvin Johns, MD      . magnesium hydroxide (MILK OF MAGNESIA) suspension 30 mL  30 mL Oral Daily PRN Denzil Magnuson, NP      . omega-3 acid ethyl esters (LOVAZA) capsule 1 g  1 g Oral BID Malvin Johns, MD   1 g at 05/09/18 5621  . propranolol (INDERAL) tablet 10 mg  10 mg Oral BID Malvin Johns, MD   10 mg at 05/09/18 3086  . risperiDONE (RISPERDAL) tablet 3 mg  3 mg Oral BID Malvin Johns, MD   3 mg at 05/09/18 5784  . temazepam (RESTORIL) capsule 30 mg  30 mg Oral QHS Malvin Johns, MD   30 mg at 05/08/18 2107  . traZODone (DESYREL) tablet 50 mg  50 mg Oral QHS PRN Denzil Magnuson, NP       PTA Medications: Medications Prior to Admission  Medication Sig Dispense Refill Last Dose  . ARIPiprazole (ABILIFY) 10 MG tablet Take 10 mg by mouth daily.   05/06/2018 at Unknown time  . benztropine (COGENTIN) 0.5 MG tablet Take 1 tablet (0.5  mg total) by mouth 2 (two) times daily. (Patient not taking: Reported on 05/07/2018) 60 tablet 2 Not Taking at Unknown time  . cetirizine (ZYRTEC) 10 MG tablet Take 1 tablet (10 mg total) by mouth daily. (Patient not taking: Reported on 05/05/2018) 15 tablet 0 Not Taking at Unknown time  . divalproex (DEPAKOTE) 500 MG DR tablet Take 500 mg by mouth at bedtime.    05/06/2018 at Unknown time  . FLUoxetine (PROZAC) 20 MG capsule Take 1 capsule (20 mg total) by mouth daily. 30 capsule 3 05/06/2018 at Unknown time  . fluticasone (FLONASE) 50 MCG/ACT nasal spray Place 2 sprays into both nostrils daily. (Patient taking differently: Place 2 sprays into both nostrils daily as needed for allergies or rhinitis. ) 1 g 0 Past Week at Unknown time  . hydrOXYzine (ATARAX/VISTARIL) 50 MG tablet Take 50 mg by mouth 2 (two) times daily as needed for anxiety.    05/06/2018 at Unknown time  . Ibuprofen-diphenhydrAMINE HCl (ADVIL PM) 200-25 MG CAPS Take 2 tablets by mouth at bedtime as needed (sleep).   05/06/2018 at Unknown time  . LORazepam (ATIVAN) 1 MG tablet Take 1 tablet (1 mg total) by mouth every 8 (  eight) hours as needed for anxiety. (Patient not taking: Reported on 05/07/2018) 30 tablet 0 Not Taking at Unknown time  . omeprazole (PRILOSEC) 20 MG capsule Take 1 capsule (20 mg total) by mouth daily. (Patient taking differently: Take 20 mg by mouth daily as needed (acid reflux). ) 30 capsule 0 Past Week at Unknown time    Patient Stressors: Health problems Medication change or noncompliance  Patient Strengths: Geographical information systems officerGeneral fund of knowledge Motivation for treatment/growth  Treatment Modalities: Medication Management, Group therapy, Case management,  1 to 1 session with clinician, Psychoeducation, Recreational therapy.   Physician Treatment Plan for Primary Diagnosis: <principal problem not specified> Long Term Goal(s): Improvement in symptoms so as ready for discharge Improvement in symptoms so as ready for discharge    Short Term Goals: Ability to verbalize feelings will improve Compliance with prescribed medications will improve  Medication Management: Evaluate patient's response, side effects, and tolerance of medication regimen.  Therapeutic Interventions: 1 to 1 sessions, Unit Group sessions and Medication administration.  Evaluation of Outcomes: Progressing  Physician Treatment Plan for Secondary Diagnosis: Active Problems:   Schizophrenia (HCC)  Long Term Goal(s): Improvement in symptoms so as ready for discharge Improvement in symptoms so as ready for discharge   Short Term Goals: Ability to verbalize feelings will improve Compliance with prescribed medications will improve     Medication Management: Evaluate patient's response, side effects, and tolerance of medication regimen.  Therapeutic Interventions: 1 to 1 sessions, Unit Group sessions and Medication administration.  Evaluation of Outcomes: Progressing   RN Treatment Plan for Primary Diagnosis: <principal problem not specified> Long Term Goal(s): Knowledge of disease and therapeutic regimen to maintain health will improve  Short Term Goals: Ability to remain free from injury will improve, Ability to identify and develop effective coping behaviors will improve and Compliance with prescribed medications will improve  Medication Management: RN will administer medications as ordered by provider, will assess and evaluate patient's response and provide education to patient for prescribed medication. RN will report any adverse and/or side effects to prescribing provider.  Therapeutic Interventions: 1 on 1 counseling sessions, Psychoeducation, Medication administration, Evaluate responses to treatment, Monitor vital signs and CBGs as ordered, Perform/monitor CIWA, COWS, AIMS and Fall Risk screenings as ordered, Perform wound care treatments as ordered.  Evaluation of Outcomes: Progressing   LCSW Treatment Plan for Primary Diagnosis:  <principal problem not specified> Long Term Goal(s): Safe transition to appropriate next level of care at discharge, Engage patient in therapeutic group addressing interpersonal concerns.  Short Term Goals: Engage patient in aftercare planning with referrals and resources, Increase social support, Identify triggers associated with mental health/substance abuse issues and Increase skills for wellness and recovery  Therapeutic Interventions: Assess for all discharge needs, 1 to 1 time with Social worker, Explore available resources and support systems, Assess for adequacy in community support network, Educate family and significant other(s) on suicide prevention, Complete Psychosocial Assessment, Interpersonal group therapy.  Evaluation of Outcomes: Progressing   Progress in Treatment: Attending groups: No. Participating in groups: No. Taking medication as prescribed: Yes. Toleration medication: Yes. Family/Significant other contact made: No, will contact:  mother, Aneta Minsrnez Patient understands diagnosis: Yes. Discussing patient identified problems/goals with staff: Yes. Medical problems stabilized or resolved: Yes. Denies suicidal/homicidal ideation: Yes. Issues/concerns per patient self-inventory: Yes.  New problem(s) identified: Yes, Describe:  No income  New Short Term/Long Term Goal(s): medication management for mood stabilization; elimination of SI thoughts; development of comprehensive mental wellness/sobriety plan.  Patient Goals:  Wants to  get better sleep and medication management  Discharge Plan or Barriers: Discharge home and continue receiving outpatient services from [facility]  Reason for Continuation of Hospitalization: Anxiety Delusions  Medication stabilization  Estimated Length of Stay: 3-5 days  Attendees: Patient: Bryan Kirk 05/09/2018 9:12 AM  Physician:  05/09/2018 9:12 AM  Nursing:  05/09/2018 9:12 AM  RN Care Manager: 05/09/2018 9:12 AM   Social Worker: Enid Cutter, LCSWA 05/09/2018 9:12 AM  Recreational Therapist:  05/09/2018 9:12 AM  Other:  05/09/2018 9:12 AM  Other:  05/09/2018 9:12 AM  Other: 05/09/2018 9:12 AM    Scribe for Treatment Team: Darreld Mclean, LCSWA 05/09/2018 9:12 AM

## 2018-05-09 NOTE — Progress Notes (Signed)
The focus of this group is to help patients establish daily goals to achieve during treatment and discuss how the patient can incorporate goal setting into their daily lives to aide in recovery.  Pt participated in goals group and stated that he wants to continue to take medications and improve.

## 2018-05-09 NOTE — Progress Notes (Signed)
[facility] Progress [facility]  05/09/2018 9:53 AM Bryan Kirk  MRN:  [id number] Subjective:    Patient lying in bed not give me much to work with today barely participates in the mental status exam with much encouragement he tends to be sluggish he does states he is not overmedicated he denies wanting to harm self or others he denies current positive symptoms. Blood pressure still up no EPS or TD   Principal Problem: Schizophreniform disorder/cannabis dependence long-term Diagnosis: Active Problems:   Schizophrenia (HCC)  Total Time spent with patient: 20 minutes  Past Medical History:  Past Medical History:  Diagnosis Date  . Anxiety   . Depression    History reviewed. No pertinent surgical history. Family History:  Family History  Problem Relation Age of Onset  . Healthy Mother   . Healthy Father   Social History:  Social History   Substance and Sexual Activity  Alcohol Use Never  . Frequency: Never     Social History   Substance and Sexual Activity  Drug Use Yes  . Types: Marijuana   Comment: last use 12/13/17    Social History   Socioeconomic History  . Marital status: Single    Spouse name: Not on file  . Number of children: Not on file  . Years of education: Not on file  . Highest education level: Not on file  Occupational History  . Not on file  Social Needs  . Financial resource strain: Not on file  . Food insecurity:    Worry: Not on file    Inability: Not on file  . Transportation needs:    Medical: Not on file    Non-medical: Not on file  Tobacco Use  . Smoking status: Never Smoker  . Smokeless tobacco: Never Used  Substance and Sexual Activity  . Alcohol use: Never    Frequency: Never  . Drug use: Yes    Types: Marijuana    Comment: last use 12/13/17  . Sexual activity: Not Currently  Lifestyle  . Physical activity:    Days per week: Not on file    Minutes per session: Not on file  . Stress: Not on file  Relationships  . Social  connections:    Talks on phone: Not on file    Gets together: Not on file    Attends religious service: Not on file    Active member of club or organization: Not on file    Attends meetings of clubs or organizations: Not on file    Relationship status: Not on file  Other Topics Concern  . Not on file  Social History Narrative  . Not on file   Additional Social History:                         Sleep: Good  Appetite:  Good  Current Medications: Current Facility-Administered Medications  Medication Dose Route Frequency Provider Last Rate Last Dose  . acetaminophen (TYLENOL) tablet 650 mg  650 mg Oral Q6H PRN Denzil Magnusonhomas, Lashunda, NP   650 mg at 05/08/18 0532  . alum & mag hydroxide-simeth (MAALOX/MYLANTA) 200-200-20 MG/5ML suspension 30 mL  30 mL Oral Q4H PRN Denzil Magnusonhomas, Lashunda, NP      . benztropine (COGENTIN) tablet 0.5 mg  0.5 mg Oral BID Malvin JohnsFarah, Kaoru Rezendes, MD   0.5 mg at 05/09/18 [id number]  . divalproex (DEPAKOTE) DR tablet 500 mg  500 mg Oral QHS Jackelyn PolingBerry, Jason A, NP   500  mg at 05/08/18 2107  . hydrOXYzine (ATARAX/VISTARIL) tablet 50 mg  50 mg Oral TID PRN Jackelyn Poling, NP   50 mg at 05/08/18 2107  . LORazepam (ATIVAN) tablet 2 mg  2 mg Oral Q6H PRN Malvin Johns, MD       Or  . LORazepam (ATIVAN) injection 2 mg  2 mg Intramuscular Q4H PRN Malvin Johns, MD      . magnesium hydroxide (MILK OF MAGNESIA) suspension 30 mL  30 mL Oral Daily PRN Denzil Magnuson, NP      . omega-3 acid ethyl esters (LOVAZA) capsule 1 g  1 g Oral BID Malvin Johns, MD   1 g at 05/09/18 1610  . propranolol (INDERAL) tablet 40 mg  40 mg Oral BID Malvin Johns, MD      . risperiDONE (RISPERDAL) tablet 3 mg  3 mg Oral BID Malvin Johns, MD   3 mg at 05/09/18 9604  . temazepam (RESTORIL) capsule 30 mg  30 mg Oral QHS Malvin Johns, MD   30 mg at 05/08/18 2107  . traZODone (DESYREL) tablet 50 mg  50 mg Oral QHS PRN Denzil Magnuson, NP        Lab Results:  Results for orders placed or performed during the hospital  encounter of 05/07/18 (from the past 48 hour(s))  Hemoglobin A1c     Status: Abnormal   Collection Time: 05/08/18  6:12 PM  Result Value Ref Range   Hgb A1c MFr Bld 4.7 (L) 4.8 - 5.6 %    Comment: (NOTE) Pre diabetes:          5.7%-6.4% Diabetes:              >6.4% Glycemic control for   <7.0% adults with diabetes    Mean Plasma Glucose 88.19 mg/dL    Comment: Performed at [facility], [address], [place], [place] [address]  Lipid panel     Status: Abnormal   Collection Time: 05/08/18  6:12 PM  Result Value Ref Range   Cholesterol 154 0 - 200 mg/dL   Triglycerides 55 <119 mg/dL   HDL 28 (L) >14 mg/dL   Total CHOL/HDL Ratio 5.5 RATIO   VLDL 11 0 - 40 mg/dL   LDL Cholesterol 782 (H) 0 - 99 mg/dL    Comment:        Total Cholesterol/HDL:CHD Risk Coronary Heart Disease Risk Table                     Men   Women  1/2 Average Risk   3.4   3.3  Average Risk       5.0   4.4  2 X Average Risk   9.6   7.1  3 X Average Risk  23.4   11.0        Use the calculated Patient Ratio above and the CHD Risk Table to determine the patient's CHD Risk.        ATP III CLASSIFICATION (LDL):  <100     mg/dL   Optimal  956-213  mg/dL   Near or Above                    Optimal  130-159  mg/dL   Borderline  086-578  mg/dL   High  >469     mg/dL   Very High Performed at [facility], [address], [place], [place] [address]     Blood Alcohol level:  Lab Results  Component Value Date   ETH <10 05/07/2018   ETH <10 05/05/2018    Metabolic Disorder Labs: Lab Results  Component Value Date   HGBA1C 4.7 (L) 05/08/2018   MPG 88.19 05/08/2018   MPG 88.19 03/11/2018   Lab Results  Component Value Date   PROLACTIN 34.9 (H) 03/11/2018   Lab Results  Component Value Date   CHOL 154 05/08/2018   TRIG 55 05/08/2018   HDL 28 (L) 05/08/2018   CHOLHDL 5.5 05/08/2018   VLDL 11 05/08/2018   LDLCALC 115 (H) 05/08/2018   LDLCALC 121 (H) 03/11/2018    Physical  Findings: AIMS: Facial and Oral Movements Muscles of Facial Expression: None, normal Lips and Perioral Area: None, normal Jaw: None, normal Tongue: None, normal,Extremity Movements Upper (arms, wrists, hands, fingers): None, normal Lower (legs, knees, ankles, toes): None, normal, Trunk Movements Neck, shoulders, hips: None, normal, Overall Severity Severity of abnormal movements (highest score from questions above): None, normal Incapacitation due to abnormal movements: None, normal Patient's awareness of abnormal movements (rate only patient's report): No Awareness, Dental Status Current problems with teeth and/or dentures?: No Does patient usually wear dentures?: No  CIWA:  CIWA-Ar Total: 0 COWS:  COWS Total Score: 0  Musculoskeletal: Strength & Muscle Tone: within normal limits Gait & Station: normal Patient leans: N/A  Psychiatric Specialty Exam: Physical Exam  ROS  Blood pressure (!) 149/117, pulse (!) 143, temperature 98 F (36.7 C), temperature source Oral, resp. rate 18, height  (1.651 m), weight 92.5 kg.Body mass index is 33.95 kg/m.  General Appearance: Disheveled  Eye Contact:  Fair  Speech:  Garbled, Slow and Slurred  Volume:  Decreased  Mood:  Dysphoric  Affect:  Blunt and Congruent  Thought Process:  Irrelevant and NA ,loose  Orientation:  Full (Time, Place, and Person)  Thought Content:  Illogical and Delusions  Suicidal Thoughts:  No  Homicidal Thoughts:  No  Memory:  Immediate;   Fair  Judgement:  Impaired  Insight:  Lacking  Psychomotor Activity:  Decreased  Concentration:  Concentration: Fair  Recall:  Poor  Fund of Knowledge:  Poor  Language:  Poor  Akathisia:  Negative  Handed:  Right  AIMS (if indicated):     Assets:  Physical Health Resilience  ADL's:  Intact  Cognition:  WNL  Sleep:  Number of Hours: 8.5     Treatment Plan Summary: Daily contact with patient to assess and evaluate symptoms and progress in treatment, Medication  management and Plan Escalate beta-blocker continue reality-based therapy continue to encourage participation no change in precautions  Naquisha Whitehair, MD 05/09/2018, 9:53 AM

## 2018-05-09 NOTE — Progress Notes (Signed)
Recreation Therapy Notes  Patient admitted to unit 4.27.20. Due to admission within last year, no new assessment conducted at this time. Last assessment conducted 3.2.20. Patient reports no changes in stressors from previous admission.  Patient expressed reason for admission was depression and not sleeping.  Patient denies SI, HI, AVH at this time. Patient reports goal of staying calm.  Information found below from assessment conducted 3.2.20:  Coping Skills:  Write, Sports, TV, Music, Exercise, Meditate, Deep Breathing, Substance Abuse, Talk, Prayer, Avoidance, Read, Dance, Hot Bath/Shower  Leisure Interests:  Video Games, Basketball  Patient Strengths:  Honest, Caring for other people  Areas of Improvement:  Staying calm, Try to stay focused, Keep brain occupied    [place], LRT/CTRS     Caroll Rancher A 05/09/2018 11:58 AM

## 2018-05-10 LAB — PROLACTIN: Prolactin: 5.6 ng/mL (ref 4.0–15.2)

## 2018-05-10 MED ORDER — RISPERIDONE 3 MG PO TABS
3.0000 mg | ORAL_TABLET | Freq: Three times a day (TID) | ORAL | Status: DC
  Administered 2018-05-10 – 2018-05-13 (×6): 3 mg via ORAL
  Filled 2018-05-10 (×18): qty 1

## 2018-05-10 MED ORDER — BENZTROPINE MESYLATE 1 MG PO TABS
1.0000 mg | ORAL_TABLET | Freq: Two times a day (BID) | ORAL | Status: DC
  Administered 2018-05-10 – 2018-05-13 (×4): 1 mg via ORAL
  Filled 2018-05-10 (×3): qty 1
  Filled 2018-05-10 (×3): qty 14
  Filled 2018-05-10 (×3): qty 1
  Filled 2018-05-10: qty 14
  Filled 2018-05-10 (×4): qty 1
  Filled 2018-05-10: qty 14
  Filled 2018-05-10: qty 1

## 2018-05-10 MED ORDER — PROPRANOLOL HCL 20 MG PO TABS
20.0000 mg | ORAL_TABLET | Freq: Two times a day (BID) | ORAL | Status: DC
  Administered 2018-05-10: 20 mg via ORAL
  Filled 2018-05-10 (×4): qty 1

## 2018-05-10 MED ORDER — CLONAZEPAM 1 MG PO TABS
2.0000 mg | ORAL_TABLET | Freq: Once | ORAL | Status: AC
  Administered 2018-05-10: 2 mg via ORAL
  Filled 2018-05-10: qty 2

## 2018-05-10 NOTE — Progress Notes (Signed)
Pt did not attend orientation/goals group this morning.   

## 2018-05-10 NOTE — Progress Notes (Signed)
[place] NOVEL CORONAVIRUS (COVID-19) DAILY CHECK-OFF SYMPTOMS - answer yes or no to each - every day NO YES  Have you had a fever in the past 24 hours?  . Fever (Temp > 37.80C / 100F) X   Have you had any of these symptoms in the past 24 hours? . New Cough .  Sore Throat  .  Shortness of Breath .  Difficulty Breathing .  Unexplained Body Aches   X   Have you had any one of these symptoms in the past 24 hours not related to allergies?   . Runny Nose .  Nasal Congestion .  Sneezing   X   If you have had runny nose, nasal congestion, sneezing in the past 24 hours, has it worsened?  X   EXPOSURES - check yes or no X   Have you traveled outside the state in the past 14 days?  X   Have you been in contact with someone with a confirmed diagnosis of COVID-19 or PUI in the past 14 days without wearing appropriate PPE?  X   Have you been living in the same home as a person with confirmed diagnosis of COVID-19 or a PUI (household contact)?    X   Have you been diagnosed with COVID-19?    X              What to do next: Answered NO to all: Answered YES to anything:   Proceed with unit schedule Follow the [facility] Inpatient Flowsheet.   

## 2018-05-10 NOTE — Progress Notes (Signed)
[facility] ([place]) MD Progress Note  05/10/2018 9:07 AM Bryan Kirk  MRN:  [id number] Subjective:    Patient intrusive and argumentative today requesting discharge then demanding discharge stating there is nothing wrong with him so forth.  We discussed that he was recently here and that he may not be responding to the current medications he is singularly focused on discharge no EPS or TD we will give an escalating dose of risperidone and give a one-time dose of clonazepam as he is intrusive and getting in other patients spaces Principal Problem: Exacerbation of psychotic disorder Diagnosis: Active Problems:   Schizophrenia (HCC)  Total Time spent with patient: 20 minutes  Past Medical History:  Past Medical History:  Diagnosis Date  . Anxiety   . Depression    History reviewed. No pertinent surgical history. Family History:  Family History  Problem Relation Age of Onset  . Healthy Mother   . Healthy Father     Social History:  Social History   Substance and Sexual Activity  Alcohol Use Never  . Frequency: Never     Social History   Substance and Sexual Activity  Drug Use Yes  . Types: Marijuana   Comment: last use 12/13/17    Social History   Socioeconomic History  . Marital status: Single    Spouse name: Not on file  . Number of children: Not on file  . Years of education: Not on file  . Highest education level: Not on file  Occupational History  . Not on file  Social Needs  . Financial resource strain: Not on file  . Food insecurity:    Worry: Not on file    Inability: Not on file  . Transportation needs:    Medical: Not on file    Non-medical: Not on file  Tobacco Use  . Smoking status: Never Smoker  . Smokeless tobacco: Never Used  Substance and Sexual Activity  . Alcohol use: Never    Frequency: Never  . Drug use: Yes    Types: Marijuana    Comment: last use 12/13/17  . Sexual activity: Not Currently  Lifestyle  . Physical activity:    Days per  week: Not on file    Minutes per session: Not on file  . Stress: Not on file  Relationships  . Social connections:    Talks on phone: Not on file    Gets together: Not on file    Attends religious service: Not on file    Active member of club or organization: Not on file    Attends meetings of clubs or organizations: Not on file    Relationship status: Not on file  Other Topics Concern  . Not on file  Social History Narrative  . Not on file   Additional Social History:                         Sleep: Fair  Appetite:  Fair  Current Medications: Current Facility-Administered Medications  Medication Dose Route Frequency Provider Last Rate Last Dose  . acetaminophen (TYLENOL) tablet 650 mg  650 mg Oral Q6H PRN Denzil Magnuson, NP   650 mg at 05/08/18 0532  . alum & mag hydroxide-simeth (MAALOX/MYLANTA) 200-200-20 MG/5ML suspension 30 mL  30 mL Oral Q4H PRN Denzil Magnuson, NP      . benztropine (COGENTIN) tablet 1 mg  1 mg Oral BID Malvin Johns, MD      . clonazePAM Scarlette Calico) tablet  2 mg  2 mg Oral Once Malvin Johns, MD      . divalproex (DEPAKOTE) DR tablet 500 mg  500 mg Oral QHS Nira Conn A, NP   500 mg at 05/09/18 2116  . hydrOXYzine (ATARAX/VISTARIL) tablet 50 mg  50 mg Oral TID PRN Jackelyn Poling, NP   50 mg at 05/09/18 1703  . LORazepam (ATIVAN) tablet 2 mg  2 mg Oral Q6H PRN Malvin Johns, MD       Or  . LORazepam (ATIVAN) injection 2 mg  2 mg Intramuscular Q4H PRN Malvin Johns, MD      . magnesium hydroxide (MILK OF MAGNESIA) suspension 30 mL  30 mL Oral Daily PRN Denzil Magnuson, NP      . omega-3 acid ethyl esters (LOVAZA) capsule 1 g  1 g Oral BID Malvin Johns, MD   1 g at 05/10/18 0813  . propranolol (INDERAL) tablet 20 mg  20 mg Oral BID Malvin Johns, MD      . risperiDONE (RISPERDAL) tablet 3 mg  3 mg Oral TID Malvin Johns, MD      . temazepam (RESTORIL) capsule 30 mg  30 mg Oral QHS Malvin Johns, MD   30 mg at 05/09/18 2116  . traZODone (DESYREL)  tablet 50 mg  50 mg Oral QHS PRN Denzil Magnuson, NP        Lab Results:  Results for orders placed or performed during the hospital encounter of 05/07/18 (from the past 48 hour(s))  Hemoglobin A1c     Status: Abnormal   Collection Time: 05/08/18  6:12 PM  Result Value Ref Range   Hgb A1c MFr Bld 4.7 (L) 4.8 - 5.6 %    Comment: (NOTE) Pre diabetes:          5.7%-6.4% Diabetes:              >6.4% Glycemic control for   <7.0% adults with diabetes    Mean Plasma Glucose 88.19 mg/dL    Comment: Performed at [facility], [address], [place], [place] [address]  Lipid panel     Status: Abnormal   Collection Time: 05/08/18  6:12 PM  Result Value Ref Range   Cholesterol 154 0 - 200 mg/dL   Triglycerides 55 <604 mg/dL   HDL 28 (L) >54 mg/dL   Total CHOL/HDL Ratio 5.5 RATIO   VLDL 11 0 - 40 mg/dL   LDL Cholesterol 098 (H) 0 - 99 mg/dL    Comment:        Total Cholesterol/HDL:CHD Risk Coronary Heart Disease Risk Table                     Men   Women  1/2 Average Risk   3.4   3.3  Average Risk       5.0   4.4  2 X Average Risk   9.6   7.1  3 X Average Risk  23.4   11.0        Use the calculated Patient Ratio above and the CHD Risk Table to determine the patient's CHD Risk.        ATP III CLASSIFICATION (LDL):  <100     mg/dL   Optimal  119-147  mg/dL   Near or Above                    Optimal  130-159  mg/dL   Borderline  829-562  mg/dL   High  >  190     mg/dL   Very High Performed at [facility], [address] [place], [place] [address]   Prolactin     Status: None   Collection Time: 05/08/18  6:12 PM  Result Value Ref Range   Prolactin 5.6 4.0 - 15.2 ng/mL    Comment: (NOTE) Performed At: [facility] [address] [place], [place] [id number] Jolene SchimkeNagendra Sanjai MD XB:[id number]:[phone]     Blood Alcohol level:  Lab Results  Component Value Date   [facility] <10 05/07/2018   ETH <10 05/05/2018    Metabolic Disorder Labs: Lab Results   Component Value Date   HGBA1C 4.7 (L) 05/08/2018   MPG 88.19 05/08/2018   MPG 88.19 03/11/2018   Lab Results  Component Value Date   PROLACTIN 5.6 05/08/2018   PROLACTIN 34.9 (H) 03/11/2018   Lab Results  Component Value Date   CHOL 154 05/08/2018   TRIG 55 05/08/2018   HDL 28 (L) 05/08/2018   CHOLHDL 5.5 05/08/2018   VLDL 11 05/08/2018   LDLCALC 115 (H) 05/08/2018   LDLCALC 121 (H) 03/11/2018    Physical Findings: AIMS: Facial and Oral Movements Muscles of Facial Expression: None, normal Lips and Perioral Area: None, normal Jaw: None, normal Tongue: None, normal,Extremity Movements Upper (arms, wrists, hands, fingers): None, normal Lower (legs, knees, ankles, toes): None, normal, Trunk Movements Neck, shoulders, hips: None, normal, Overall Severity Severity of abnormal movements (highest score from questions above): None, normal Incapacitation due to abnormal movements: None, normal Patient's awareness of abnormal movements (rate only patient's report): No Awareness, Dental Status Current problems with teeth and/or dentures?: No Does patient usually wear dentures?: No  CIWA:  CIWA-Ar Total: 0 COWS:  COWS Total Score: 0  Musculoskeletal: Strength & Muscle Tone: within normal limits Gait & Station: normal Patient leans: N/A  Psychiatric Specialty Exam: Physical Exam  ROS  Blood pressure 118/81, pulse 83, temperature 98.6 F (37 C), temperature source Oral, resp. rate 18, height 5\' 5"  (1.651 m), weight 92.5 kg.Body mass index is 33.95 kg/m.  General Appearance: Casual  Eye Contact:  Fair  Speech:  Normal Rate  Volume:  Decreased  Mood:  Anxious and Irritable  Affect:  Congruent and Constricted  Thought Process:  Goal Directed and Irrelevant  Orientation:  Full (Time, Place, and Person)  Thought Content:  Illogical, Delusions and Tangential  Suicidal Thoughts:  No  Homicidal Thoughts:  No  Memory:  Immediate;   Poor  Judgement:  Impaired  Insight:  Lacking   Psychomotor Activity:  Decreased  Concentration:  Concentration: Fair  Recall:  Poor  Fund of Knowledge:  Fair  Language:  Good  Akathisia:  Negative  Handed:  Right  AIMS (if indicated):     Assets:  Leisure Time Physical Health  ADL's:  Intact  Cognition:  WNL  Sleep:  Number of Hours: 6.25     Treatment Plan Summary: Daily contact with patient to assess and evaluate symptoms and progress in treatment, Medication management and Plan Continue current precautions escalate Risperdal one-time dose of clonazepam continue reality-based therapy but patient refusing groups  Jacier Gladu, MD 05/10/2018, 9:07 AM

## 2018-05-10 NOTE — [facility] Suicide Risk Assessment (Signed)
[facility] INPATIENT:  Family/Significant Other Suicide Prevention Education  Suicide Prevention Education:  Education Completed; Duriel Stranger, mother, [phone], has been identified by the patient as the family member/significant other with whom the patient will be residing, and identified as the person(s) who will aid the patient in the event of a mental health crisis (suicidal ideations/suicide attempt).  With written consent from the patient, the family member/significant other has been provided the following suicide prevention education, prior to the and/or following the discharge of the patient.  The suicide prevention education provided includes the following:  Suicide risk factors  Suicide prevention and interventions  National Suicide Hotline telephone number  [facility] assessment telephone number  [facility] Emergency Assistance 911  [facility] and/or Residential Mobile Crisis Unit telephone number  Request made of family/significant other to:  Remove weapons (e.g., guns, rifles, knives), all items previously/currently identified as safety concern.  No guns, per mother.    Remove drugs/medications (over-the-counter, prescriptions, illicit drugs), all items previously/currently identified as a safety concern.  The family member/significant other verbalizes understanding of the suicide prevention education information provided.  The family member/significant other agrees to remove the items of safety concern listed above.  Mother reports she has been talking to pt frequently, he is still making paranoid statements and hearing things.  Pt has been going to his appts at [facility] have been making medication changes, most recently with depakote, which pt did not want to take at home due to it being a large pill.  Pt likes [facility] and it seems like a good fit for follow up.  Mom hoping to get the medication right and continue services  there.  Lorri Frederick, LCSW 05/10/2018, 2:21 PM

## 2018-05-10 NOTE — Progress Notes (Signed)
Recreation Therapy Notes  Date: 4.30.20 Time:  1000 Location: 500 Hall Dayroom  Group Topic: Communication, Team Building, Problem Solving  Goal Area(s) Addresses:  Patient will effectively work with peer towards shared goal.  Patient will identify skills used to make activity successful.  Patient will identify how skills used during activity can be used to reach post d/c goals.   Behavioral Response: None  Intervention: STEM Activity  Activity: Landing Pad. In teams patients were given 12 plastic drinking straws and a length of masking tape. Using the materials provided patients were asked to build a landing pad to catch a golf ball dropped from approximately 6 feet in the air.   Education: Pharmacist, community, Discharge Planning   Education Outcome: Acknowledges education/In group clarification offered/Needs additional education.   Clinical Observations/Feedback: Pt did not participate in group.  Pt was in and out of group.  Pt eventually left and did not return.    Caroll Rancher, LRT/CTRS     Caroll Rancher A 05/10/2018 10:56 AM

## 2018-05-10 NOTE — Progress Notes (Signed)
DAR Note: Pt observed in milieu interacting well with peers and staff for most of this shift. Pt has been preoccupied with d/c and going home all day. Became somatic this evening "I have pain in my chest" which started after he got a room mate "I don't want him in there, can I get a different person".  Vitals done, WNL and documented in chart. Pt is asymptomatic, ambulatory with a steady gait, affect does not match pt's presentation and complaint; as he's smiling and demanding to go to [facility] ED. Pt did not attend scheduled groups despite multiple attempts / prompts.  Q 15 minutes safety checks maintained without self harm gestures. All medications given per provider's orders with verbal education and effects monitored. Support, encouragement and reassurance offered to pt throughout this shift.  Pt remains safe on and off unit. Tolerated all PO intake well. POC continues.

## 2018-05-10 NOTE — [facility] Group Notes (Signed)
[facility] LCSW Group Therapy Note  Date/Time: 05/10/18, 1315  Type of Therapy/Topic:  Group Therapy:  Balance in Life  Participation Level:  Did not attend  Description of Group:    This group will address the concept of balance and how it feels and looks when one is unbalanced. Patients will be encouraged to process areas in their lives that are out of balance, and identify reasons for remaining unbalanced. Facilitators will guide patients utilizing problem- solving interventions to address and correct the stressor making their life unbalanced. Understanding and applying boundaries will be explored and addressed for obtaining  and maintaining a balanced life. Patients will be encouraged to explore ways to assertively make their unbalanced needs known to significant others in their lives, using other group members and facilitator for support and feedback.  Therapeutic Goals: 1. Patient will identify two or more emotions or situations they have that consume much of in their lives. 2. Patient will identify signs/triggers that life has become out of balance:  3. Patient will identify two ways to set boundaries in order to achieve balance in their lives:  4. Patient will demonstrate ability to communicate their needs through discussion and/or role plays  Summary of Patient Progress:          Therapeutic Modalities:   Cognitive Behavioral Therapy Solution-Focused Therapy Assertiveness Training  Greg Casen Pryor, LCSW 

## 2018-05-11 MED ORDER — PROPRANOLOL HCL 40 MG PO TABS
40.0000 mg | ORAL_TABLET | Freq: Two times a day (BID) | ORAL | Status: DC
  Administered 2018-05-11 – 2018-05-12 (×2): 40 mg via ORAL
  Filled 2018-05-11 (×6): qty 1

## 2018-05-11 NOTE — Progress Notes (Signed)
Progress note  This Clinical research associate spoke with the pt's mother. The mother stated the pt has been complaining of chest pain for the last few weeks. Pt confirms this. Pt's blood pressure was assessed and found to be 160/130 w/ a pulse of 115. Pt declines blood pressure medication, stating that the med is what is causing the chest pain. Pt educated that this was a new medication ordered today and he hasn't had this yet. Pt still paranoid and delusional. Pt also is wanting discharge. Pt may be using this a ploy to be transferred, as he speaks of it often. Pt was provided education on stroke and heart attack symptoms. Pt still declines medication. Will continue to monitor.

## 2018-05-11 NOTE — Progress Notes (Signed)
[facility]  05/11/2018 10:57 AM Bryan Kirk  MRN:  [id number] Subjective:    remains very intrusive singularly focused on discharge and resistant to interventions. No EPS or TD denies all positive symptoms when questioned Principal Problem: psychosis Diagnosis: Active Problems:   Schizophrenia (HCC)  Total Time spent with patient: 20 minutes  Past Medical History:  Past Medical History:  Diagnosis Date  . Anxiety   . Depression    History reviewed. No pertinent surgical history. Family History:  Family History  Problem Relation Age of Onset  . Healthy Mother   . Healthy Father     Social History:  Social History   Substance and Sexual Activity  Alcohol Use Never  . Frequency: Never     Social History   Substance and Sexual Activity  Drug Use Yes  . Types: Marijuana   Comment: last use 12/13/17    Social History   Socioeconomic History  . Marital status: Single    Spouse name: Not on file  . Number of children: Not on file  . Years of education: Not on file  . Highest education level: Not on file  Occupational History  . Not on file  Social Needs  . Financial resource strain: Not on file  . Food insecurity:    Worry: Not on file    Inability: Not on file  . Transportation needs:    Medical: Not on file    Non-medical: Not on file  Tobacco Use  . Smoking status: Never Smoker  . Smokeless tobacco: Never Used  Substance and Sexual Activity  . Alcohol use: Never    Frequency: Never  . Drug use: Yes    Types: Marijuana    Comment: last use 12/13/17  . Sexual activity: Not Currently  Lifestyle  . Physical activity:    Days per week: Not on file    Minutes per session: Not on file  . Stress: Not on file  Relationships  . Social connections:    Talks on phone: Not on file    Gets together: Not on file    Attends religious service: Not on file    Active member of club or organization: Not on file    Attends meetings of clubs or  organizations: Not on file    Relationship status: Not on file  Other Topics Concern  . Not on file  Social History Narrative  . Not on file   Additional Social History:                         Sleep: Good  Appetite:  Good  Current Medications: Current Facility-Administered Medications  Medication Dose Route Frequency Provider Last Rate Last Dose  . acetaminophen (TYLENOL) tablet 650 mg  650 mg Oral Q6H PRN Denzil Magnusonhomas, Lashunda, NP   650 mg at 05/08/18 0532  . alum & mag hydroxide-simeth (MAALOX/MYLANTA) 200-200-20 MG/5ML suspension 30 mL  30 mL Oral Q4H PRN Denzil Magnusonhomas, Lashunda, NP      . benztropine (COGENTIN) tablet 1 mg  1 mg Oral BID Malvin JohnsFarah, Chevy Virgo, MD   1 mg at 05/10/18 1703  . divalproex (DEPAKOTE) DR tablet 500 mg  500 mg Oral QHS Nira ConnBerry, Jason A, NP   500 mg at 05/09/18 2116  . hydrOXYzine (ATARAX/VISTARIL) tablet 50 mg  50 mg Oral TID PRN Jackelyn PolingBerry, Jason A, NP   50 mg at 05/09/18 1703  . LORazepam (ATIVAN) tablet 2 mg  2  mg Oral Q6H PRN Malvin Johns, MD       Or  . LORazepam (ATIVAN) injection 2 mg  2 mg Intramuscular Q4H PRN Malvin Johns, MD      . magnesium hydroxide (MILK OF MAGNESIA) suspension 30 mL  30 mL Oral Daily PRN Denzil Magnuson, NP      . omega-3 acid ethyl esters (LOVAZA) capsule 1 g  1 g Oral BID Malvin Johns, MD   1 g at 05/10/18 1703  . propranolol (INDERAL) tablet 40 mg  40 mg Oral BID Malvin Johns, MD      . risperiDONE (RISPERDAL) tablet 3 mg  3 mg Oral TID Malvin Johns, MD   3 mg at 05/10/18 1705  . temazepam (RESTORIL) capsule 30 mg  30 mg Oral QHS Malvin Johns, MD   30 mg at 05/10/18 2044  . traZODone (DESYREL) tablet 50 mg  50 mg Oral QHS PRN Denzil Magnuson, NP        Lab Results: No results found for this or any previous visit (from the past 48 hour(s)).  Blood Alcohol level:  Lab Results  Component Value Date   ETH <10 05/07/2018   ETH <10 05/05/2018    Metabolic Disorder Labs: Lab Results  Component Value Date   HGBA1C 4.7 (L)  05/08/2018   MPG 88.19 05/08/2018   MPG 88.19 03/11/2018   Lab Results  Component Value Date   PROLACTIN 5.6 05/08/2018   PROLACTIN 34.9 (H) 03/11/2018   Lab Results  Component Value Date   CHOL 154 05/08/2018   TRIG 55 05/08/2018   HDL 28 (L) 05/08/2018   CHOLHDL 5.5 05/08/2018   VLDL 11 05/08/2018   LDLCALC 115 (H) 05/08/2018   LDLCALC 121 (H) 03/11/2018    Physical Findings: AIMS: Facial and Oral Movements Muscles of Facial Expression: None, normal Lips and Perioral Area: None, normal Jaw: None, normal Tongue: None, normal,Extremity Movements Upper (arms, wrists, hands, fingers): None, normal Lower (legs, knees, ankles, toes): None, normal, Trunk Movements Neck, shoulders, hips: None, normal, Overall Severity Severity of abnormal movements (highest score from questions above): None, normal Incapacitation due to abnormal movements: None, normal Patient's awareness of abnormal movements (rate only patient's report): No Awareness, Dental Status Current problems with teeth and/or dentures?: No Does patient usually wear dentures?: No  CIWA:  CIWA-Ar Total: 0 COWS:  COWS Total Score: 0  Musculoskeletal: Strength & Muscle Tone: within normal limits Gait & Station: normal Patient leans: N/A  Psychiatric Specialty Exam: Physical Exam HTN  ROS no eps no td  Blood pressure (!) 121/101, pulse 100, temperature 97.6 F (36.4 C), temperature source Oral, resp. rate 18, height 5\' 5"  (1.651 m), weight 92.5 kg, SpO2 100 %.Body mass index is 33.95 kg/m.  General Appearance: Casual  Eye Contact:  Minimal  Speech:  Slow and Slurred  Volume:  Decreased  Mood:  Dysphoric and Irritable  Affect:  Congruent  Thought Process:  Disorganized, Irrelevant and Descriptions of Associations: Loose  Orientation:  Full (Time, Place, and Person)  Thought Content:  Illogical, Delusions and Tangential  Suicidal Thoughts:  No  Homicidal Thoughts:  No  Memory:  Recent;   Fair  Judgement:   Impaired  Insight:  Lacking  Psychomotor Activity:  Decreased  Concentration:  Concentration: Fair  Recall:  [employer] of Knowledge:  Fair  Language:  Fair  Akathisia:  Negative  Handed:  Right  AIMS (if indicated):     Assets:  Physical Health Resilience  ADL's:  Intact  Cognition:  WNL  Sleep:  Number of Hours: 6.25     Treatment Plan Summary: Daily contact with patient to assess and evaluate symptoms and progress in treatment, Medication management and Plan Continue current antipsychotics cognitive therapy reality-based therapy, continue to encourage compliance no change in precautions  Bryan Purdy, MD 05/11/2018, 10:57 AM

## 2018-05-11 NOTE — Plan of Care (Signed)
Progress note  D: pt found in bed; allowed to rest. Pt declined his morning meds, as he stated they have made him feel drowsy. Pt has been resting most of the day. Pt denies any physical pain or symptoms, but does appear anxious and perhaps paranoid. Pt denies si/hi/vh and verbally agrees to approach staff if these become apparent or before harming himself or others. Pt does endorse ah and states the voices are telling him to leave.  A: pt provided support and encouragement. Pt provided education on his medication. Q53m safety checks implemented and continued.  R: pt safe on the unit. Will continue to monitor.   Pt progressing in the following metrics  Problem: Coping: Goal: Ability to demonstrate self-control will improve Outcome: Progressing   Problem: Physical Regulation: Goal: Ability to maintain clinical measurements within normal limits will improve Outcome: Progressing   Problem: Safety: Goal: Periods of time without injury will increase Outcome: Progressing   Problem: Safety: Goal: Ability to remain free from injury will improve Outcome: Progressing

## 2018-05-11 NOTE — [facility] Group Notes (Signed)
  [facility] LCSW Group Therapy Note  Date/Time: 05/11/18, 1310  Type of Therapy/Topic:  Group Therapy:  Emotion Regulation  Participation Level:  Did Not Attend   Mood:  Description of Group:    The purpose of this group is to assist patients in learning to regulate negative emotions and experience positive emotions. Patients will be guided to discuss ways in which they have been vulnerable to their negative emotions. These vulnerabilities will be juxtaposed with experiences of positive emotions or situations, and patients challenged to use positive emotions to combat negative ones. Special emphasis will be placed on coping with negative emotions in conflict situations, and patients will process healthy conflict resolution skills.  Therapeutic Goals: 1. Patient will identify two positive emotions or experiences to reflect on in order to balance out negative emotions:  2. Patient will label two or more emotions that they find the most difficult to experience:  3. Patient will be able to demonstrate positive conflict resolution skills through discussion or role plays:   Summary of Patient Progress:       Therapeutic Modalities:   Cognitive Behavioral Therapy Feelings Identification Dialectical Behavioral Therapy  Greg Lester Platas, LCSW 

## 2018-05-11 NOTE — Progress Notes (Signed)
D: Patient observed restless and agitated last evening at the start of shift. Upset that he has a roommate. Eye contact avertive, patient guarded. Denies pain, physical complaints. COVID-19 screen negative, afebrile. Respiratory assessment WDL.  A: Medicated per orders, no prns requested. Medication education provided. Level III obs in place for safety. Emotional support attempted.  Encouraged to attend and participate in unit programming.   R:  Mouth check performed and patient appeared to be cheeking depakote. Patient eventually spit pill out and said, "I don't want that." Patient would not take later when this writer offered and encouraged patient to take. Patient denies SI/HI/AVH and remains safe on level III obs.

## 2018-05-11 NOTE — Progress Notes (Signed)
Recreation Therapy Notes  Date: 5.1.20 Time: 1000 Location: 500 Hall Dayroom  Group Topic: Communication  Goal Area(s) Addresses:  Patient will effectively communicate with peers in group.  Patient will verbalize benefit of healthy communication. Patient will verbalize positive effect of healthy communication on post d/c goals.  Patient will identify communication techniques that made activity effective for group.   Intervention:  Plain paper, pencils, drawings   Activity: Geometrical Drawings.  Patients took turns describing a geometrical drawing to the group.  The speaker could describe the picture in as much detail as needed.  The patients who were drawing could only ask the speaker to repeat themselves.  They could not ask any detailed questions.  At the completion of each drawing, patients would compare their drawing to the original picture.  Education: Communication, Discharge Planning  Education Outcome: Acknowledges understanding/In group clarification offered/Needs additional education.   Clinical Observations/Feedback: Pt did not attend group.    Bryan Kirk, LRT/CTRS         Catelynn Sparger A 05/11/2018 12:48 PM 

## 2018-05-12 DIAGNOSIS — F209 Schizophrenia, unspecified: Principal | ICD-10-CM

## 2018-05-12 NOTE — Progress Notes (Signed)
D: Patient observed in dayroom. Watchful of others. Patient guarded and suspicious. He has refused meds all day. Patient denies SI/HI/VH however endorses AH that he reports are quite distressful. "I hear them constantly, telling me what to do. The medications are making me feel off, bad." Denies pain, physical complaints. COVID-19 screen negative, afebrile. Respiratory assessment WDL.  A: Patient initially refused meds. Sat with patient and medication education provided. Strongly encouraged patient to take medications particularly for his hypertension. Level III obs in place for safety. Emotional support offered and reassurance given.   R: Patient did take hs meds as well as inderal he had refused earlier in the day. Patient grabbed chest after doing so and was assisted with some breathing exercises. Patitn calmer after 1:1 time with this Clinical research associate. Patient verbalizes understanding of POC.  Patient remains safe on level III obs. Will continue to monitor throughout the night.

## 2018-05-12 NOTE — [facility] Group Notes (Signed)
  [facility] LCSW Group Therapy Note  Date/Time:  05/12/2018 11:15AM-12:00PM  Type of Therapy and Topic:  Group Therapy:  Feelings About Hospitalization  Participation Level:  None   Description of Group This process group involved patients discussing their feelings related to being hospitalized, as well as the benefits they see to being in the hospital.  These feelings and benefits were itemized.  The group then brainstormed specific ways in which they could seek those same benefits when they discharge and return home.  Therapeutic Goals 1. Patient will identify and describe positive and negative feelings related to hospitalization 2. Patient will verbalize benefits of hospitalization to themselves personally 3. Patients will brainstorm together ways they can obtain similar benefits in the outpatient setting, identify barriers to wellness and possible solutions  Summary of Patient Progress:  Patient did not attend group, arrived only a few minutes before it ended.  Therapeutic Modalities Cognitive Behavioral Therapy Motivational Interviewing    Ambrose Mantle, LCSW 05/12/2018, 9:18 AM

## 2018-05-12 NOTE — Progress Notes (Addendum)
[facility]  05/12/2018 11:29 AM Winslow Particia Jasperntonio Len Rufino  MRN:  [id number]   Subjective:    Sheehan " I feel like I am ready to go today, you know when I can leave?"  Evaluation: Davaion is awake alert and oriented x3.  Patient is requesting to be discharged soon, as he reports been in the hospital for 5 days.  Reports he was admitted for chest pain and headaches.  He denies suicidal and homicidal ideations.  Denies auditory visual hallucinations.  However reports depression due to " I have a lot going on" stated being picked on by coworkers.  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  Patient appears to be minimizing symptoms. Thought blocking noted throughout  this assessment.   Rates his depression 3 out of 10 with 10 being the worst.  Reports taking and tolerating medications well.-Patient to consider initiating antidepressant i.e. Prozac or Zoloft. Declined at this time.  We will follow-up on 05/13/2018 support, encouragement and  reassurance was provided.  History: Per admission assessment 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   Principal Problem: psychosis Diagnosis: Active Problems:   Schizophrenia (HCC)  Total Time spent with patient: 20 minutes  Past Medical History:  Past Medical History:  Diagnosis Date  . Anxiety   . Depression    History reviewed. No pertinent surgical history. Family History:  Family History  Problem Relation Age of Onset  . Healthy Mother   . Healthy Father     Social History:  Social History   Substance and Sexual  Activity  Alcohol Use Never  . Frequency: Never     Social History   Substance and Sexual Activity  Drug Use Yes  . Types: Marijuana   Comment: last use 12/13/17    Social History   Socioeconomic History  . Marital status: Single    Spouse name: Not on file  . Number of children: Not on file  . Years of education: Not on file  . Highest education level: Not on file  Occupational History  . Not on file  Social Needs  . Financial resource strain: Not on file  . Food insecurity:    Worry: Not on file    Inability: Not on file  . Transportation needs:    Medical: Not on file    Non-medical: Not on file  Tobacco Use  . Smoking status: Never Smoker  . Smokeless tobacco: Never Used  Substance and Sexual Activity  . Alcohol use: Never    Frequency: Never  . Drug use: Yes    Types: Marijuana    Comment: last use 12/13/17  . Sexual activity: Not Currently  Lifestyle  . Physical activity:    Days per week: Not on file    Minutes per session: Not on file  . Stress: Not on file  Relationships  . Social connections:    Talks on phone: Not on file    Gets together: Not on file    Attends religious service: Not on file    Active member of  club or organization: Not on file    Attends meetings of clubs or organizations: Not on file    Relationship status: Not on file  Other Topics Concern  . Not on file  Social History Narrative  . Not on file   Additional Social History:                         Sleep: Good  Appetite:  Good  Current Medications: Current Facility-Administered Medications  Medication Dose Route Frequency Provider Last Rate Last Dose  . acetaminophen (TYLENOL) tablet 650 mg  650 mg Oral Q6H PRN Denzil Magnuson, NP   650 mg at 05/08/18 0532  . alum & mag hydroxide-simeth (MAALOX/MYLANTA) 200-200-20 MG/5ML suspension 30 mL  30 mL Oral Q4H PRN Denzil Magnuson, NP      . benztropine (COGENTIN) tablet 1 mg  1 mg Oral BID Malvin Johns, MD   1 mg at  05/10/18 1703  . divalproex (DEPAKOTE) DR tablet 500 mg  500 mg Oral QHS Nira Conn A, NP   500 mg at 05/11/18 2109  . hydrOXYzine (ATARAX/VISTARIL) tablet 50 mg  50 mg Oral TID PRN Nira Conn A, NP   50 mg at 05/12/18 0423  . LORazepam (ATIVAN) tablet 2 mg  2 mg Oral Q6H PRN Malvin Johns, MD       Or  . LORazepam (ATIVAN) injection 2 mg  2 mg Intramuscular Q4H PRN Malvin Johns, MD      . magnesium hydroxide (MILK OF MAGNESIA) suspension 30 mL  30 mL Oral Daily PRN Denzil Magnuson, NP      . omega-3 acid ethyl esters (LOVAZA) capsule 1 g  1 g Oral BID Malvin Johns, MD   1 g at 05/10/18 1703  . propranolol (INDERAL) tablet 40 mg  40 mg Oral BID Malvin Johns, MD   40 mg at 05/11/18 2109  . risperiDONE (RISPERDAL) tablet 3 mg  3 mg Oral TID Malvin Johns, MD   3 mg at 05/10/18 1705  . temazepam (RESTORIL) capsule 30 mg  30 mg Oral QHS Malvin Johns, MD   30 mg at 05/11/18 2109  . traZODone (DESYREL) tablet 50 mg  50 mg Oral QHS PRN Denzil Magnuson, NP        Lab Results: No results found for this or any previous visit (from the past 48 hour(s)).  Blood Alcohol level:  Lab Results  Component Value Date   ETH <10 05/07/2018   ETH <10 05/05/2018    Metabolic Disorder Labs: Lab Results  Component Value Date   HGBA1C 4.7 (L) 05/08/2018   MPG 88.19 05/08/2018   MPG 88.19 03/11/2018   Lab Results  Component Value Date   PROLACTIN 5.6 05/08/2018   PROLACTIN 34.9 (H) 03/11/2018   Lab Results  Component Value Date   CHOL 154 05/08/2018   TRIG 55 05/08/2018   HDL 28 (L) 05/08/2018   CHOLHDL 5.5 05/08/2018   VLDL 11 05/08/2018   LDLCALC 115 (H) 05/08/2018   LDLCALC 121 (H) 03/11/2018    Physical Findings: AIMS: Facial and Oral Movements Muscles of Facial Expression: None, normal Lips and Perioral Area: None, normal Jaw: None, normal Tongue: None, normal,Extremity Movements Upper (arms, wrists, hands, fingers): None, normal Lower (legs, knees, ankles, toes): None, normal,  Trunk Movements Neck, shoulders, hips: None, normal, Overall Severity Severity of abnormal movements (highest score from questions above): None, normal Incapacitation due to abnormal movements: None, normal Patient's awareness of abnormal movements (  rate only patient's report): No Awareness, Dental Status Current problems with teeth and/or dentures?: No Does patient usually wear dentures?: No  CIWA:  CIWA-Ar Total: 0 COWS:  COWS Total Score: 0  Musculoskeletal: Strength & Muscle Tone: within normal limits Gait & Station: normal Patient leans: N/A  Psychiatric Specialty Exam: Physical Exam  Constitutional: He appears well-developed.   HTN  Review of Systems  Psychiatric/Behavioral: Positive for depression and hallucinations (Paranoia). The patient is not nervous/anxious.   All other systems reviewed and are negative.  no eps no td  Blood pressure 108/79, pulse (!) 126, temperature 97.6 F (36.4 C), temperature source Oral, resp. rate 18, height  (1.651 m), weight 92.5 kg, SpO2 100 %.Body mass index is 33.95 kg/m.  General Appearance: Casual and Guarded  Eye Contact:  Fair  Speech:  Clear and Coherent  Volume:  Decreased  Mood:  Dysphoric and Irritable  Affect:  Congruent  Thought Process:  Coherent, Irrelevant and Descriptions of Associations: Loose  Orientation:  Full (Time, Place, and Person)  Thought Content:  Logical, Delusions and Paranoid Ideation  Suicidal Thoughts:  No  Homicidal Thoughts:  No  Memory:  Recent;   Fair  Judgement:  Impaired  Insight:  Lacking  Psychomotor Activity:  Decreased  Concentration:  Concentration: Fair  Recall:  [employer] of Knowledge:  Fair  Language:  Fair  Akathisia:  Negative  Handed:  Right  AIMS (if indicated):     Assets:  Physical Health Resilience  ADL's:  Intact  Cognition:  WNL  Sleep:  Number of Hours: 0.5     Treatment Plan Summary: Daily contact with patient to assess and evaluate symptoms and progress in  treatment and Medication management   Continue with current treatment plan on 05/12/2018 as listed below except were noted  Schizophrenia:  Continue Depakote 500 mg p.o. nightly Continue Resporal 3 mg p.o. 3 times daily Continue Cogentin 1 mg p.o. twice daily Continue Restoril 30 mg p.o. nightly   CSW to continue working on discharge disposition Patient encouraged to participate within the milieu   Oneta Rack, NP 05/12/2018, 11:29 AM Attest to NP Progress Note

## 2018-05-12 NOTE — Progress Notes (Signed)
Pt hypotensive. Pt b/p 60/50 automatically. Manual 76/52. Pt c/o of light dizziness. Pt given fluids. Pt assessed by MD. Pt b/p reassessed 117/82, HR 75.

## 2018-05-12 NOTE — Progress Notes (Signed)
Pt  Presents with a flat affect. Pt appears to be paranoid and resistant to tx. Pt refused to take morning meds stating that it makes his body feel weird. Writer encouraged pt to take medications and after several attempts the pt agreed to taking morning medications. Pt noted to have minimal interaction on the unit and is isolative to this room. Pt denies AVH. Pt denies SI/HI.   Medications reviewed with pt. Verbal support provided. Pt encouraged to attend groups. 15 minute checks performed for safety.  Pt resistant to tx.

## 2018-05-12 NOTE — Progress Notes (Signed)
Did not attend group 

## 2018-05-13 MED ORDER — PROPRANOLOL HCL 20 MG PO TABS
20.0000 mg | ORAL_TABLET | Freq: Two times a day (BID) | ORAL | Status: DC
  Administered 2018-05-13: 17:00:00 20 mg via ORAL
  Filled 2018-05-13: qty 14
  Filled 2018-05-13: qty 1
  Filled 2018-05-13: qty 2
  Filled 2018-05-13: qty 1
  Filled 2018-05-13: qty 14
  Filled 2018-05-13 (×2): qty 1
  Filled 2018-05-13 (×2): qty 14

## 2018-05-13 MED ORDER — TEMAZEPAM 15 MG PO CAPS
15.0000 mg | ORAL_CAPSULE | Freq: Every day | ORAL | Status: DC
  Administered 2018-05-13: 21:00:00 15 mg via ORAL
  Filled 2018-05-13: qty 1

## 2018-05-13 NOTE — [facility] Group Notes (Signed)
[facility] Group Notes: (Clinical Social Work)   05/13/2018      Type of Therapy:  Group Therapy   Participation Level:  Did Not Attend - was invited both individually by [facility] and by overhead announcement   Persephanie Laatsch Grossman-Orr, LCSW 05/13/2018, 12:11 PM     

## 2018-05-13 NOTE — Progress Notes (Signed)
Nursing Progress Note: 7p-7a D: Pt currently presents with a depressed/anxious/paranoid/avoidant affect and behavior. Interacting minimally with the milieu. Pt reports good sleep during the previous night with current medication regimen.   A: Pt provided with medications per providers orders. Pt's labs and vitals were monitored throughout the night. Pt supported emotionally and encouraged to express concerns and questions. Pt educated on medications.  R: Pt's safety ensured with 15 minute and environmental checks. Pt currently denies SI, HI, and VH and endorses AH. Pt verbally contracts to seek staff if SI,HI, or AVH occurs and to consult with staff before acting on any harmful thoughts. Will continue to monitor.

## 2018-05-13 NOTE — Progress Notes (Signed)
DAR NOTE: Pt present with flat affect and depressed mood in the unit. Pt has been isolating himself and has been bed most of the time. Pt has not been eating well. Pile of food trays found in his room. Pt talked of hearing voices of people talking about him. Pt encouraged to get up, pt got up after lunch and has been staying in the dayroom with peers. Pt denies physical pain, took all his meds as scheduled.  Pt's safety ensured with 15 minute and environmental checks. Pt currently denies SI/HI and A/V hallucinations. Pt verbally agrees to seek staff if SI/HI or A/VH occurs and to consult with staff before acting on these thoughts. Will continue POC.

## 2018-05-13 NOTE — Plan of Care (Addendum)
D: Patient is alert, pleasant, and cooperative. Denies SI, HI, AVH, and verbally contracts for safety. Patient reports mild anxiety but feels less anxious than before. He reports a good day. Patient denies physical symptoms/pain.    A: Medications administered per MD order. Support provided. Patient educated on safety on the unit and medications. Routine safety checks every 15 minutes. Patient stated understanding to tell nurse about any new physical symptoms. Patient understands to tell staff of any needs.     R: No adverse drug reactions noted. Patient verbally contracts for safety. Patient remains safe at this time and will continue to monitor.   Problem: Education: Goal: Emotional status will improve Outcome: Progressing   Problem: Safety: Goal: Periods of time without injury will increase Outcome: Progressing   Patient denies SI, HI, AVH, and contracts for safety. Patient reports a good day. Reports he feels a little anxious but better than before. Patient remains safe and will continue to monitor.   Green NOVEL CORONAVIRUS (COVID-19) DAILY CHECK-OFF SYMPTOMS - answer yes or no to each - every day NO YES  Have you had a fever in the past 24 hours?  . Fever (Temp > 37.80C / 100F) X   Have you had any of these symptoms in the past 24 hours? . New Cough .  Sore Throat  .  Shortness of Breath .  Difficulty Breathing .  Unexplained Body Aches   X   Have you had any one of these symptoms in the past 24 hours not related to allergies?   . Runny Nose .  Nasal Congestion .  Sneezing   X   If you have had runny nose, nasal congestion, sneezing in the past 24 hours, has it worsened?  X   EXPOSURES - check yes or no X   Have you traveled outside the state in the past 14 days?  X   Have you been in contact with someone with a confirmed diagnosis of COVID-19 or PUI in the past 14 days without wearing appropriate PPE?  X   Have you been living in the same home as a person with  confirmed diagnosis of COVID-19 or a PUI (household contact)?    X   Have you been diagnosed with COVID-19?    X              What to do next: Answered NO to all: Answered YES to anything:   Proceed with unit schedule Follow the [facility] Inpatient Flowsheet.

## 2018-05-13 NOTE — Progress Notes (Addendum)
[facility] MD Progress Note  05/13/2018 10:33 AM Bryan Kirk  MRN:  [id number]     Evaluation: Bryan Kirk seen resting in bed.  Continues to present flat, guarded but pleasant.  Reports paranoia of others talking about him.  Chart review patient continues to isolate to his room.  Denying suicidal or homicidal ideations.  Reports taking medications as directed.  Chart review patient had concerns with low BP and dizziness on yesterday.  Decreased Inderal 40 mg twice daily to 20 mg twice daily.  Education provided with sitting before standing.  Continue to encourage hydration.  Denies medication side effects.  Denies auditory visual hallucinations.  Support, encouragement reassurance was provided.  History: Per admission assessment Bryan Kirk is 23 years of age he is known to the service- was last admitted on 3/1 of this year, at that point in time it was his first admission- he is a long-term cannabis dependent patient who had recently declined as far as functioning and he began having psychotic symptoms- during that stay was diagnosed with a schizophreniform type condition.He was discharged on a combination of risperidone 4 mg at night prazosin for nightmares temazepam for insomnia benztropine as well.   Principal Problem: psychosis Diagnosis: Active Problems:   Schizophrenia (HCC)  Total Time spent with patient: 20 minutes  Past Medical History:  Past Medical History:  Diagnosis Date  . Anxiety   . Depression    History reviewed. No pertinent surgical history. Family History:  Family History  Problem Relation Age of Onset  . Healthy Mother   . Healthy Father     Social History:  Social History   Substance and Sexual Activity  Alcohol Use Never  . Frequency: Never     Social History   Substance and Sexual Activity  Drug Use Yes  . Types: Marijuana   Comment: last use 12/13/17    Social History   Socioeconomic History  . Marital status: Single    Spouse name: Not on  file  . Number of children: Not on file  . Years of education: Not on file  . Highest education level: Not on file  Occupational History  . Not on file  Social Needs  . Financial resource strain: Not on file  . Food insecurity:    Worry: Not on file    Inability: Not on file  . Transportation needs:    Medical: Not on file    Non-medical: Not on file  Tobacco Use  . Smoking status: Never Smoker  . Smokeless tobacco: Never Used  Substance and Sexual Activity  . Alcohol use: Never    Frequency: Never  . Drug use: Yes    Types: Marijuana    Comment: last use 12/13/17  . Sexual activity: Not Currently  Lifestyle  . Physical activity:    Days per week: Not on file    Minutes per session: Not on file  . Stress: Not on file  Relationships  . Social connections:    Talks on phone: Not on file    Gets together: Not on file    Attends religious service: Not on file    Active member of club or organization: Not on file    Attends meetings of clubs or organizations: Not on file    Relationship status: Not on file  Other Topics Concern  . Not on file  Social History Narrative  . Not on file   Additional Social History:  Sleep: Good  Appetite:  Good  Current Medications: Current Facility-Administered Medications  Medication Dose Route Frequency Provider Last Rate Last Dose  . acetaminophen (TYLENOL) tablet 650 mg  650 mg Oral Q6H PRN Denzil Magnusonhomas, Lashunda, NP   650 mg at 05/08/18 0532  . alum & mag hydroxide-simeth (MAALOX/MYLANTA) 200-200-20 MG/5ML suspension 30 mL  30 mL Oral Q4H PRN Denzil Magnusonhomas, Lashunda, NP      . benztropine (COGENTIN) tablet 1 mg  1 mg Oral BID Malvin JohnsFarah, Brian, MD   1 mg at 05/13/18 0809  . divalproex (DEPAKOTE) DR tablet 500 mg  500 mg Oral QHS Nira ConnBerry, Jason A, NP   500 mg at 05/12/18 2112  . hydrOXYzine (ATARAX/VISTARIL) tablet 50 mg  50 mg Oral TID PRN Jackelyn PolingBerry, Jason A, NP   50 mg at 05/12/18 2112  . LORazepam (ATIVAN) tablet 2 mg   2 mg Oral Q6H PRN Malvin JohnsFarah, Brian, MD   2 mg at 05/12/18 2112   Or  . LORazepam (ATIVAN) injection 2 mg  2 mg Intramuscular Q4H PRN Malvin JohnsFarah, Brian, MD      . magnesium hydroxide (MILK OF MAGNESIA) suspension 30 mL  30 mL Oral Daily PRN Denzil Magnusonhomas, Lashunda, NP      . omega-3 acid ethyl esters (LOVAZA) capsule 1 g  1 g Oral BID Malvin JohnsFarah, Brian, MD   1 g at 05/13/18 0955  . propranolol (INDERAL) tablet 40 mg  40 mg Oral BID Malvin JohnsFarah, Brian, MD   Stopped at 05/13/18 0800  . risperiDONE (RISPERDAL) tablet 3 mg  3 mg Oral TID Malvin JohnsFarah, Brian, MD   3 mg at 05/13/18 0809  . temazepam (RESTORIL) capsule 30 mg  30 mg Oral QHS Malvin JohnsFarah, Brian, MD   Stopped at 05/12/18 2200    Lab Results: No results found for this or any previous visit (from the past 48 hour(s)).  Blood Alcohol level:  Lab Results  Component Value Date   ETH <10 05/07/2018   ETH <10 05/05/2018    Metabolic Disorder Labs: Lab Results  Component Value Date   HGBA1C 4.7 (L) 05/08/2018   MPG 88.19 05/08/2018   MPG 88.19 03/11/2018   Lab Results  Component Value Date   PROLACTIN 5.6 05/08/2018   PROLACTIN 34.9 (H) 03/11/2018   Lab Results  Component Value Date   CHOL 154 05/08/2018   TRIG 55 05/08/2018   HDL 28 (L) 05/08/2018   CHOLHDL 5.5 05/08/2018   VLDL 11 05/08/2018   LDLCALC 115 (H) 05/08/2018   LDLCALC 121 (H) 03/11/2018    Physical Findings: AIMS: Facial and Oral Movements Muscles of Facial Expression: None, normal Lips and Perioral Area: None, normal Jaw: None, normal Tongue: None, normal,Extremity Movements Upper (arms, wrists, hands, fingers): None, normal Lower (legs, knees, ankles, toes): None, normal, Trunk Movements Neck, shoulders, hips: None, normal, Overall Severity Severity of abnormal movements (highest score from questions above): None, normal Incapacitation due to abnormal movements: None, normal Patient's awareness of abnormal movements (rate only patient's report): No Awareness, Dental Status Current  problems with teeth and/or dentures?: No Does patient usually wear dentures?: No  CIWA:  CIWA-Ar Total: 0 COWS:  COWS Total Score: 0  Musculoskeletal: Strength & Muscle Tone: within normal limits Gait & Station: normal Patient leans: N/A  Psychiatric Specialty Exam: Physical Exam  Constitutional: He appears well-developed.   HTN  Review of Systems  Psychiatric/Behavioral: Positive for depression and hallucinations (Paranoia). The patient is not nervous/anxious.   All other systems reviewed and are negative.  no eps  no td  Blood pressure 117/82, pulse 75, temperature 97.6 F (36.4 C), temperature source Oral, resp. rate 18, height 5\' 5"  (1.651 m), weight 92.5 kg, SpO2 100 %.Body mass index is 33.95 kg/m.  General Appearance: Casual and Guarded  Eye Contact:  Fair  Speech:  Clear and Coherent  Volume:  Decreased  Mood:  Dysphoric and Irritable  Affect:  Congruent  Thought Process:  Coherent, Irrelevant and Descriptions of Associations: Loose  Orientation:  Full (Time, Place, and Person)  Thought Content:  Logical, Delusions and Paranoid Ideation  Suicidal Thoughts:  No  Homicidal Thoughts:  No  Memory:  Recent;   Fair  Judgement:  Impaired  Insight:  Lacking  Psychomotor Activity:  Decreased  Concentration:  Concentration: Fair  Recall:  [employer] of Knowledge:  Fair  Language:  Fair  Akathisia:  Negative  Handed:  Right  AIMS (if indicated):     Assets:  Physical Health Resilience  ADL's:  Intact  Cognition:  WNL  Sleep:  Number of Hours: 6     Treatment Plan Summary: Daily contact with patient to assess and evaluate symptoms and progress in treatment and Medication management   Continue with current treatment plan on 05/13/2018 as listed below except were noted  Schizophrenia:  Continue Depakote 500 mg p.o. nightly- valproic level- pending results  Continue Resporal 3 mg p.o. 3 times daily Continue Cogentin 1 mg p.o. twice daily Decreased Restoril 30 mg to  15 mg p.o. nightly  HTN:  Deceased Inderal 40 mg po BID to 20 mg BID    CSW to continue working on discharge disposition Patient encouraged to participate within the milieu   Oneta Rack, NP 05/13/2018, 10:33 AM  Attest to NP Progress Note

## 2018-05-13 NOTE — Progress Notes (Signed)
[place] NOVEL CORONAVIRUS (COVID-19) DAILY CHECK-OFF SYMPTOMS - answer yes or no to each - every day NO YES  Have you had a fever in the past 24 hours?  . Fever (Temp > 37.80C / 100F) X   Have you had any of these symptoms in the past 24 hours? . New Cough .  Sore Throat  .  Shortness of Breath .  Difficulty Breathing .  Unexplained Body Aches   X   Have you had any one of these symptoms in the past 24 hours not related to allergies?   . Runny Nose .  Nasal Congestion .  Sneezing   X   If you have had runny nose, nasal congestion, sneezing in the past 24 hours, has it worsened?  X   EXPOSURES - check yes or no X   Have you traveled outside the state in the past 14 days?  X   Have you been in contact with someone with a confirmed diagnosis of COVID-19 or PUI in the past 14 days without wearing appropriate PPE?  X   Have you been living in the same home as a person with confirmed diagnosis of COVID-19 or a PUI (household contact)?    X   Have you been diagnosed with COVID-19?    X              What to do next: Answered NO to all: Answered YES to anything:   Proceed with unit schedule Follow the [facility] Inpatient Flowsheet.   

## 2018-05-14 LAB — VALPROIC ACID LEVEL: Valproic Acid Lvl: 67 ug/mL (ref 50.0–100.0)

## 2018-05-14 MED ORDER — ARIPIPRAZOLE ER 400 MG IM SRER
400.0000 mg | INTRAMUSCULAR | Status: DC
  Administered 2018-05-14: 13:00:00 400 mg via INTRAMUSCULAR

## 2018-05-14 MED ORDER — ARIPIPRAZOLE ER 400 MG IM SRER: 400.0000 mg | INTRAMUSCULAR | 11 refills | Status: AC

## 2018-05-14 MED ORDER — TEMAZEPAM 15 MG PO CAPS: 15.0000 mg | ORAL_CAPSULE | Freq: Every day | ORAL | 0 refills | Status: AC

## 2018-05-14 MED ORDER — OMEGA-3-ACID ETHYL ESTERS 1 G PO CAPS: 1.0000 g | ORAL_CAPSULE | Freq: Two times a day (BID) | ORAL | 11 refills | Status: AC

## 2018-05-14 MED ORDER — RISPERIDONE 3 MG PO TABS: 6.0000 mg | ORAL_TABLET | Freq: Every day | ORAL | 1 refills | Status: AC

## 2018-05-14 MED ORDER — RISPERIDONE 3 MG PO TABS
6.0000 mg | ORAL_TABLET | Freq: Every day | ORAL | Status: DC
  Filled 2018-05-14: qty 14

## 2018-05-14 MED ORDER — BENZTROPINE MESYLATE 1 MG PO TABS: 1.0000 mg | ORAL_TABLET | Freq: Two times a day (BID) | ORAL | 2 refills | Status: AC

## 2018-05-14 MED ORDER — PROPRANOLOL HCL 20 MG PO TABS: 20.0000 mg | ORAL_TABLET | Freq: Two times a day (BID) | ORAL | 2 refills | Status: AC

## 2018-05-14 NOTE — Plan of Care (Signed)
Pt did not identify coping strategies to decrease depression.   Caroll Rancher, LRT/CTRS

## 2018-05-14 NOTE — Plan of Care (Signed)
Discharge note  Patient verbalizes readiness for discharge. Follow up plan explained, AVS, Transition record and SRA given. Prescriptions and teaching provided. Belongings returned and signed for. Suicide safety plan completed and signed. Patient verbalizes understanding. Patient denies SI/HI and assures this Clinical research associate he will seek assistance should that change. Patient discharged to lobby where mom was waiting.  Problem: Education: Goal: Knowledge of [facility] Education information/materials will improve Outcome: Adequate for Discharge Goal: Emotional status will improve Outcome: Adequate for Discharge Goal: Mental status will improve Outcome: Adequate for Discharge Goal: Verbalization of understanding the information provided will improve Outcome: Adequate for Discharge   Problem: Activity: Goal: Interest or engagement in activities will improve Outcome: Adequate for Discharge Goal: Sleeping patterns will improve Outcome: Adequate for Discharge   Problem: Coping: Goal: Ability to verbalize frustrations and anger appropriately will improve Outcome: Adequate for Discharge Goal: Ability to demonstrate self-control will improve Outcome: Adequate for Discharge   Problem: Health Behavior/Discharge Planning: Goal: Identification of resources available to assist in meeting health care needs will improve Outcome: Adequate for Discharge Goal: Compliance with treatment plan for underlying cause of condition will improve Outcome: Adequate for Discharge   Problem: Physical Regulation: Goal: Ability to maintain clinical measurements within normal limits will improve Outcome: Adequate for Discharge   Problem: Safety: Goal: Periods of time without injury will increase Outcome: Adequate for Discharge   Problem: Activity: Goal: Will verbalize the importance of balancing activity with adequate rest periods Outcome: Adequate for Discharge   Problem: Education: Goal: Will be free of  psychotic symptoms Outcome: Adequate for Discharge Goal: Knowledge of the prescribed therapeutic regimen will improve Outcome: Adequate for Discharge   Problem: Coping: Goal: Coping ability will improve Outcome: Adequate for Discharge Goal: Will verbalize feelings Outcome: Adequate for Discharge   Problem: Health Behavior/Discharge Planning: Goal: Compliance with prescribed medication regimen will improve Outcome: Adequate for Discharge   Problem: Nutritional: Goal: Ability to achieve adequate nutritional intake will improve Outcome: Adequate for Discharge   Problem: Role Relationship: Goal: Ability to communicate needs accurately will improve Outcome: Adequate for Discharge Goal: Ability to interact with others will improve Outcome: Adequate for Discharge   Problem: Safety: Goal: Ability to redirect hostility and anger into socially appropriate behaviors will improve Outcome: Adequate for Discharge Goal: Ability to remain free from injury will improve Outcome: Adequate for Discharge   Problem: Self-Care: Goal: Ability to participate in self-care as condition permits will improve Outcome: Adequate for Discharge   Problem: Self-Concept: Goal: Will verbalize positive feelings about self Outcome: Adequate for Discharge   Problem: Activity: Goal: Will identify at least one activity in which they can participate Outcome: Adequate for Discharge   Problem: Coping: Goal: Ability to identify and develop effective coping behavior will improve Outcome: Adequate for Discharge Goal: Ability to interact with others will improve Outcome: Adequate for Discharge Goal: Demonstration of participation in decision-making regarding own care will improve Outcome: Adequate for Discharge Goal: Ability to use eye contact when communicating with others will improve Outcome: Adequate for Discharge   Problem: Health Behavior/Discharge Planning: Goal: Identification of resources available to  assist in meeting health care needs will improve Outcome: Adequate for Discharge   Problem: Self-Concept: Goal: Will verbalize positive feelings about self Outcome: Adequate for Discharge

## 2018-05-14 NOTE — Progress Notes (Signed)
Recreation Therapy Notes  Date: 5.4.20 Time: 1000 Location: 500 Hall Dayroom  Group Topic: Anxiety  Goal Area(s) Addresses:  Patient will identify triggers for anxiety. Patient will identify physical symptoms of anxiety Patient will identify coping skills for anxiety.  Intervention:  Worksheet, pencils   Activity: Introduction to Anxiety.  Patients were to identify their triggers, physical symptoms, thoughts and coping skills for dealing with anxiety.  Education: Communication, Discharge Planning  Education Outcome: Acknowledges understanding/In group clarification offered/Needs additional education.   Clinical Observations/Feedback:  Pt did not attend group.   Caroll Rancher, LRT/CTRS     Caroll Rancher A 05/14/2018 11:37 AM

## 2018-05-14 NOTE — [facility] Suicide Risk Assessment (Signed)
[facility] Discharge Suicide Risk Assessment   Principal Problem: Exacerbation of underlying psychotic disorder Discharge Diagnoses: Active Problems:   Schizophrenia (HCC)   Total Time spent with patient: 45 minutes  Musculoskeletal: Strength & Muscle Tone: within normal limits Gait & Station: normal Patient leans: N/A  Psychiatric Specialty Exam: ROS  Blood pressure (!) 157/143, pulse (!) 107, temperature 97.6 F (36.4 C), temperature source Oral, resp. rate 18, height 5\' 5"  (1.651 m), weight 92.5 kg, SpO2 100 %.Body mass index is 33.95 kg/m.  General Appearance: Casual  Eye Contact::  Minimal  Speech:  Slow409  Volume:  Decreased  Mood:  Dysphoric  Affect:  Flat  Thought Process:  Coherent and Goal Directed  Orientation:  Full (Time, Place, and Person)  Thought Content:  Logical and Tangential  Suicidal Thoughts:  No  Homicidal Thoughts:  No  Memory:  Immediate;   Fair  Judgement:  Fair  Insight:  Good  Psychomotor Activity:  Normal  Concentration:  Fair  Recall:  [employer] of Knowledge:Fair  Language: Fair  Akathisia:  Negative  Handed:  Right  AIMS (if indicated):     Assets:  Communication Skills Desire for Improvement  Sleep:  Number of Hours: 4.25  Cognition: WNL  ADL's:  Intact   Mental Status Per Nursing Assessment::   On Admission:  Suicidal ideation indicated by patient  Demographic Factors:  Unemployed  Loss Factors: NA  Historical Factors: NA  Risk Reduction Factors:   Sense of responsibility to family and Religious beliefs about death  Continued Clinical Symptoms:  Schizophrenia:   Less than 75 years old  Cognitive Features That Contribute To Risk:  Loss of executive function    Suicide Risk:  Minimal: No identifiable suicidal ideation.  Patients presenting with no risk factors but with morbid ruminations; may be classified as minimal risk based on the severity of the depressive symptoms  Follow-up Information    [facility] Follow up on 05/18/2018.   Specialty:  Professional Counselor Why:  Therapy appointment with Lowella Bandy is Friday, 5/8 at 10:00a. The appointment will be over the phone and Lowella Bandy will contact you. Lowella Bandy will also schedule your medication management appointment.   Contact information: [facility] [address] [place] [address] [phone]           Plan Of Care/Follow-up recommendations:  Activity:  full  Ryleigh Esqueda, MD 05/14/2018, 10:44 AM

## 2018-05-14 NOTE — Discharge Summary (Signed)
Physician Discharge Summary Note  Patient:  Bryan Kirk is an 23 y.o., male MRN:  [id number] DOB:  November 08, 1995 Patient phone:  [phone] (home)  Patient address:   [address] [place] [address],  Total Time spent with patient: 45 minutes  Date of Admission:  05/07/2018 Date of Discharge: 05/14/2018  Reason for Admission:   [name] is 23 years of age he is known to the service- was last admitted on 3/1 of this year, at that point in time it was his first admission- he is a long-term cannabis dependent patient who had recently declined as far as functioning and he began having psychotic symptoms- during that stay was diagnosed with a schizophreniform type condition. He was discharged on a combination of risperidone 4 mg at night prazosin for nightmares temazepam for insomnia benztropine as well.  Patient re-presented on 4/24 complaining of chest pain through the emergency department and thus he was monitored overnight -by 4/25 was described as paranoid and requiring redirection from staff- By 4/26 he continued to endorse paranoid thoughts that were somewhat vague and acknowledge that he had not taken his psychotropic medications for couple of days and acknowledge the cannabis dependency which increased his paranoia.  Thus he was discharged on 4/26 but by 4/27 mother brought him back to emergency department he had not slept he was still paranoid and hearing voices.  He further elaborated he was indeed hearing voices telling him to leave and do other things he did not recognize the voices he denied visual hallucinations. Along the unit last night he complained of hearing helicopters quite frequent redirection thinking people are watching him and going in and out of other people's rooms.  He has no involuntary movements he denies head trauma the substance abuse continues as mentioned when I interview him he is extremely vague to stating he is here for rest and will not  give me very much meaningful information again vague evasive negative symptoms prominent.  See mental status exam below   Principal Problem: Exacerbation of underlying psychotic disorder Discharge Diagnoses: Active Problems:   Schizophrenia [facility])  Past Medical History:  Past Medical History:  Diagnosis Date  . Anxiety   . Depression    History reviewed. No pertinent surgical history. Family History:  Family History  Problem Relation Age of Onset  . Healthy Mother   . Healthy Father     Social History:  Social History   Substance and Sexual Activity  Alcohol Use Never  . Frequency: Never     Social History   Substance and Sexual Activity  Drug Use Yes  . Types: Marijuana   Comment: last use 12/13/17    Social History   Socioeconomic History  . Marital status: Single    Spouse name: Not on file  . Number of children: Not on file  . Years of education: Not on file  . Highest education level: Not on file  Occupational History  . Not on file  Social Needs  . Financial resource strain: Not on file  . Food insecurity:    Worry: Not on file    Inability: Not on file  . Transportation needs:    Medical: Not on file    Non-medical: Not on file  Tobacco Use  . Smoking status: Never Smoker  . Smokeless tobacco: Never Used  Substance and Sexual Activity  . Alcohol use: Never    Frequency: Never  . Drug use: Yes    Types: Marijuana  Comment: last use 12/13/17  . Sexual activity: Not Currently  Lifestyle  . Physical activity:    Days per week: Not on file    Minutes per session: Not on file  . Stress: Not on file  Relationships  . Social connections:    Talks on phone: Not on file    Gets together: Not on file    Attends religious service: Not on file    Active member of club or organization: Not on file    Attends meetings of clubs or organizations: Not on file    Relationship status: Not on file  Other Topics Concern  . Not on file  Social History  Narrative  . Not on file    Hospital Course:    Patient was always minimally engaged in the interview processes but we got information as we could he was always focused on discharge.  He had some hypertension while here in the presence of a normal TSH although it was lower but in the normal range.  He did receive a beta-blocker. He was compliant with meds and he had benefited in the past from aripiprazole without sensitivity so we did administer long-acting injectable prior to discharge. By the date of the fourth he was insistent upon discharge he was alert oriented cooperative with me he denied any positive symptoms he still had some poverty of content of thought but this is probably baseline as he has now prominent negative rhythm positive symptoms but I spoke with his mother who felt he had had a good weekend and felt he was at baseline.  [place]mill status exam below alert and oriented without thoughts of harming self or others contracting fully stable for release, no EPS or TD  Physical Findings: AIMS: Facial and Oral Movements Muscles of Facial Expression: None, normal Lips and Perioral Area: None, normal Jaw: None, normal Tongue: None, normal,Extremity Movements Upper (arms, wrists, hands, fingers): None, normal Lower (legs, knees, ankles, toes): None, normal, Trunk Movements Neck, shoulders, hips: None, normal, Overall Severity Severity of abnormal movements (highest score from questions above): None, normal Incapacitation due to abnormal movements: None, normal Patient's awareness of abnormal movements (rate only patient's report): No Awareness, Dental Status Current problems with teeth and/or dentures?: No Does patient usually wear dentures?: No  CIWA:  CIWA-Ar Total: 0 COWS:  COWS Total Score: 0   Musculoskeletal: Strength & Muscle Tone: within normal limits Gait & Station: normal Patient leans: N/A  Psychiatric Specialty Exam: ROS  Blood pressure (!) 157/143, pulse (!)  107, temperature 97.6 F (36.4 C), temperature source Oral, resp. rate 18, height 5\' 5"  (1.651 m), weight 92.5 kg, SpO2 100 %.Body mass index is 33.95 kg/m.  General Appearance: Casual  Eye Contact::  Minimal  Speech:  Slow409  Volume:  Decreased  Mood:  Dysphoric  Affect:  Flat  Thought Process:  Coherent and Goal Directed  Orientation:  Full (Time, Place, and Person)  Thought Content:  Logical and Tangential  Suicidal Thoughts:  No  Homicidal Thoughts:  No  Memory:  Immediate;   Fair  Judgement:  Fair  Insight:  Good  Psychomotor Activity:  Normal  Concentration:  Fair  Recall:  [employer] of Knowledge:Fair  Language: Fair  Akathisia:  Negative  Handed:  Right  AIMS (if indicated):     Assets:  Communication Skills Desire for Improvement  Sleep:  Number of Hours: 4.25  Cognition: WNL  ADL's:  Intact     Have you used any form  of tobacco in the last 30 days? (Cigarettes, Smokeless Tobacco, Cigars, and/or Pipes): No  Has this patient used any form of tobacco in the last 30 days? (Cigarettes, Smokeless Tobacco, Cigars, and/or Pipes) Yes, No  Blood Alcohol level:  Lab Results  Component Value Date   ETH <10 05/07/2018   ETH <10 05/05/2018    Metabolic Disorder Labs:  Lab Results  Component Value Date   HGBA1C 4.7 (L) 05/08/2018   MPG 88.19 05/08/2018   MPG 88.19 03/11/2018   Lab Results  Component Value Date   PROLACTIN 5.6 05/08/2018   PROLACTIN 34.9 (H) 03/11/2018   Lab Results  Component Value Date   CHOL 154 05/08/2018   TRIG 55 05/08/2018   HDL 28 (L) 05/08/2018   CHOLHDL 5.5 05/08/2018   VLDL 11 05/08/2018   LDLCALC 115 (H) 05/08/2018   LDLCALC 121 (H) 03/11/2018    See Psychiatric Specialty Exam and Suicide Risk Assessment completed by Attending Physician prior to discharge.  Discharge destination:  Home  Is patient on multiple antipsychotic therapies at discharge:  No   Has Patient had three or more failed trials of antipsychotic  monotherapy by history:  No  Recommended Plan for Multiple Antipsychotic Therapies: NA   Allergies as of 05/14/2018   No Known Allergies     Medication List    STOP taking these medications   Advil PM 200-25 MG Caps Generic drug:  Ibuprofen-diphenhydrAMINE HCl   ARIPiprazole 10 MG tablet Commonly known as:  ABILIFY Replaced by:  ARIPiprazole ER 400 MG Srer injection   FLUoxetine 20 MG capsule Commonly known as:  PROZAC   hydrOXYzine 50 MG tablet Commonly known as:  ATARAX/VISTARIL   LORazepam 1 MG tablet Commonly known as:  ATIVAN     TAKE these medications     Indication  ARIPiprazole ER 400 MG Srer injection Commonly known as:  ABILIFY MAINTENA Inject 2 mLs (400 mg total) into the muscle every 28 (twenty-eight) days. Replaces:  ARIPiprazole 10 MG tablet  Indication:  Schizophrenia   benztropine 1 MG tablet Commonly known as:  COGENTIN Take 1 tablet (1 mg total) by mouth 2 (two) times daily. What changed:    medication strength  how much to take  Indication:  Extrapyramidal Reaction caused by Medications   cetirizine 10 MG tablet Commonly known as:  ZYRTEC Take 1 tablet (10 mg total) by mouth daily.  Indication:  Angioedema   divalproex 500 MG DR tablet Commonly known as:  DEPAKOTE Take 500 mg by mouth at bedtime.  Indication:  Schizophrenia   fluticasone 50 MCG/ACT nasal spray Commonly known as:  FLONASE Place 2 sprays into both nostrils daily. What changed:    when to take this  reasons to take this  Indication:  Signs and Symptoms of Nose Diseases   omega-3 acid ethyl esters 1 g capsule Commonly known as:  LOVAZA Take 1 capsule (1 g total) by mouth 2 (two) times daily.  Indication:  High Amount of Triglycerides in the Blood   omeprazole 20 MG capsule Commonly known as:  PRILOSEC Take 1 capsule (20 mg total) by mouth daily. What changed:    when to take this  reasons to take this  Indication:  Gastroesophageal Reflux Disease    propranolol 20 MG tablet Commonly known as:  INDERAL Take 1 tablet (20 mg total) by mouth 2 (two) times daily.  Indication:  High Blood Pressure Disorder   risperiDONE 3 MG tablet Commonly known as:  RISPERDAL Take 2  tablets (6 mg total) by mouth at bedtime.  Indication:  Schizophrenia   temazepam 15 MG capsule Commonly known as:  RESTORIL Take 1 capsule (15 mg total) by mouth at bedtime.  Indication:  Trouble Sleeping      Follow-up Information    [facility] Follow up on 05/18/2018.   Specialty:  Professional Counselor Why:  Therapy appointment with Lowella Bandyikki is Friday, 5/8 at 10:00a. The appointment will be over the phone and Lowella Bandyikki will contact you. Lowella Bandyikki will also schedule your medication management appointment.   Contact information: [facility] [address] [place] [address] [phone]            Signed: Malvin JohnsFARAH,Anish Vana, MD 05/14/2018, 10:52 AM

## 2018-05-14 NOTE — Progress Notes (Signed)
  [facility] Adult Case Management Discharge Plan :  Will you be returning to the same living situation after discharge:  Yes,  with mother At discharge, do you have transportation home?: Yes,  mother Do you have the ability to pay for your medications: No. Will work with [facility]  Release of information consent forms completed and in the chart;  Patient's signature needed at discharge.  Patient to Follow up at: Follow-up Information    [facility] Follow up on 05/18/2018.   Specialty:  Professional Counselor Why:  Therapy appointment with Lowella Bandy is Friday, 5/8 at 10:00a. The appointment will be over the phone and Lowella Bandy will contact you. Medication management appointment is Thursday, 5/21 at 4:30p.  Contact information: [facility] [address] [place] [address] [phone]           Next level of care provider has access to [facility] Link:no  Safety Planning and Suicide Prevention discussed: Yes,  with mother  Have you used any form of tobacco in the last 30 days? (Cigarettes, Smokeless Tobacco, Cigars, and/or Pipes): No  Has patient been referred to the Quitline?: N/A patient is not a smoker  Patient has been referred for addiction treatment: Pt. refused referral  Lorri Frederick, LCSW 05/14/2018, 11:37 AM

## 2018-05-14 NOTE — Progress Notes (Signed)
Recreation Therapy Notes  INPATIENT RECREATION TR PLAN  Patient Details Name: Arnel Wymer MRN: [id number] DOB: [birth year] Today's Date: 05/14/2018  Rec Therapy Plan Is patient appropriate for Therapeutic Recreation?: Yes Treatment times per week: about 3 days Estimated Length of Stay: 5-7 days TR Treatment/Interventions: Group participation (Comment)  Discharge Criteria Pt will be discharged from therapy if:: Discharged Treatment plan/goals/alternatives discussed and agreed upon by:: Patient/family  Discharge Summary Short term goals set: See patient care plan Short term goals met: Not met Progress toward goals comments: Groups attended Which groups?: Wellness, Other (Comment)(Team building) Reason goals not met: Pt did not identify coping strategies and did not participate in groups attended. Therapeutic equipment acquired: N/A Reason patient discharged from therapy: Discharge from hospital Pt/family agrees with progress & goals achieved: Yes Date patient discharged from therapy: 05/14/18      Victorino Sparrow, LRT/CTRS  Ria Comment, [place] 05/14/2018, 12:06 PM

## 2018-05-14 NOTE — Tx Team (Signed)
Interdisciplinary Treatment and Diagnostic Plan Update  05/14/2018 Time of Session: [address] Bryan Kirk MRN: [id number]  Principal Diagnosis: <principal problem not specified>  Secondary Diagnoses: Active Problems:   Schizophrenia (HCC)   Current Medications:  Current Facility-Administered Medications  Medication Dose Route Frequency Provider Last Rate Last Dose  . acetaminophen (TYLENOL) tablet 650 mg  650 mg Oral Q6H PRN Denzil Magnuson, NP   650 mg at 05/08/18 0532  . alum & mag hydroxide-simeth (MAALOX/MYLANTA) 200-200-20 MG/5ML suspension 30 mL  30 mL Oral Q4H PRN Denzil Magnuson, NP      . ARIPiprazole ER (ABILIFY MAINTENA) injection 400 mg  400 mg Intramuscular Q28 days Malvin Johns, MD      . benztropine (COGENTIN) tablet 1 mg  1 mg Oral BID Malvin Johns, MD   1 mg at 05/13/18 1651  . divalproex (DEPAKOTE) DR tablet 500 mg  500 mg Oral QHS Nira Conn A, NP   500 mg at 05/13/18 2121  . hydrOXYzine (ATARAX/VISTARIL) tablet 50 mg  50 mg Oral TID PRN Jackelyn Poling, NP   50 mg at 05/12/18 2112  . LORazepam (ATIVAN) tablet 2 mg  2 mg Oral Q6H PRN Malvin Johns, MD   2 mg at 05/12/18 2112   Or  . LORazepam (ATIVAN) injection 2 mg  2 mg Intramuscular Q4H PRN Malvin Johns, MD      . magnesium hydroxide (MILK OF MAGNESIA) suspension 30 mL  30 mL Oral Daily PRN Denzil Magnuson, NP      . omega-3 acid ethyl esters (LOVAZA) capsule 1 g  1 g Oral BID Malvin Johns, MD   1 g at 05/13/18 1651  . propranolol (INDERAL) tablet 20 mg  20 mg Oral BID Oneta Rack, NP   20 mg at 05/13/18 1649  . risperiDONE (RISPERDAL) tablet 3 mg  3 mg Oral TID Malvin Johns, MD   3 mg at 05/13/18 1651  . temazepam (RESTORIL) capsule 15 mg  15 mg Oral QHS Oneta Rack, NP   15 mg at 05/13/18 2120   PTA Medications: Medications Prior to Admission  Medication Sig Dispense Refill Last Dose  . ARIPiprazole (ABILIFY) 10 MG tablet Take 10 mg by mouth daily.   05/06/2018 at Unknown time  .  benztropine (COGENTIN) 0.5 MG tablet Take 1 tablet (0.5 mg total) by mouth 2 (two) times daily. (Patient not taking: Reported on 05/07/2018) 60 tablet 2 Not Taking at Unknown time  . cetirizine (ZYRTEC) 10 MG tablet Take 1 tablet (10 mg total) by mouth daily. (Patient not taking: Reported on 05/05/2018) 15 tablet 0 Not Taking at Unknown time  . divalproex (DEPAKOTE) 500 MG DR tablet Take 500 mg by mouth at bedtime.    05/06/2018 at Unknown time  . FLUoxetine (PROZAC) 20 MG capsule Take 1 capsule (20 mg total) by mouth daily. 30 capsule 3 05/06/2018 at Unknown time  . fluticasone (FLONASE) 50 MCG/ACT nasal spray Place 2 sprays into both nostrils daily. (Patient taking differently: Place 2 sprays into both nostrils daily as needed for allergies or rhinitis. ) 1 g 0 Past Week at Unknown time  . hydrOXYzine (ATARAX/VISTARIL) 50 MG tablet Take 50 mg by mouth 2 (two) times daily as needed for anxiety.    05/06/2018 at Unknown time  . Ibuprofen-diphenhydrAMINE HCl (ADVIL PM) 200-25 MG CAPS Take 2 tablets by mouth at bedtime as needed (sleep).   05/06/2018 at Unknown time  . LORazepam (ATIVAN) 1 MG tablet Take 1 tablet (  1 mg total) by mouth every 8 (eight) hours as needed for anxiety. (Patient not taking: Reported on 05/07/2018) 30 tablet 0 Not Taking at Unknown time  . omeprazole (PRILOSEC) 20 MG capsule Take 1 capsule (20 mg total) by mouth daily. (Patient taking differently: Take 20 mg by mouth daily as needed (acid reflux). ) 30 capsule 0 Past Week at Unknown time    Patient Stressors: Health problems Medication change or noncompliance  Patient Strengths: Geographical information systems officer for treatment/growth  Treatment Modalities: Medication Management, Group therapy, Case management,  1 to 1 session with clinician, Psychoeducation, Recreational therapy.   Physician Treatment Plan for Primary Diagnosis: <principal problem not specified> Long Term Goal(s): Improvement in symptoms so as ready for  discharge Improvement in symptoms so as ready for discharge   Short Term Goals: Ability to verbalize feelings will improve Compliance with prescribed medications will improve  Medication Management: Evaluate patient's response, side effects, and tolerance of medication regimen.  Therapeutic Interventions: 1 to 1 sessions, Unit Group sessions and Medication administration.  Evaluation of Outcomes: Progressing  Physician Treatment Plan for Secondary Diagnosis: Active Problems:   Schizophrenia (HCC)  Long Term Goal(s): Improvement in symptoms so as ready for discharge Improvement in symptoms so as ready for discharge   Short Term Goals: Ability to verbalize feelings will improve Compliance with prescribed medications will improve     Medication Management: Evaluate patient's response, side effects, and tolerance of medication regimen.  Therapeutic Interventions: 1 to 1 sessions, Unit Group sessions and Medication administration.  Evaluation of Outcomes: Progressing   RN Treatment Plan for Primary Diagnosis: <principal problem not specified> Long Term Goal(s): Knowledge of disease and therapeutic regimen to maintain health will improve  Short Term Goals: Ability to remain free from injury will improve, Ability to identify and develop effective coping behaviors will improve and Compliance with prescribed medications will improve  Medication Management: RN will administer medications as ordered by provider, will assess and evaluate patient's response and provide education to patient for prescribed medication. RN will report any adverse and/or side effects to prescribing provider.  Therapeutic Interventions: 1 on 1 counseling sessions, Psychoeducation, Medication administration, Evaluate responses to treatment, Monitor vital signs and CBGs as ordered, Perform/monitor CIWA, COWS, AIMS and Fall Risk screenings as ordered, Perform wound care treatments as ordered.  Evaluation of Outcomes:  Progressing   LCSW Treatment Plan for Primary Diagnosis: <principal problem not specified> Long Term Goal(s): Safe transition to appropriate next level of care at discharge, Engage patient in therapeutic group addressing interpersonal concerns.  Short Term Goals: Engage patient in aftercare planning with referrals and resources, Increase social support, Identify triggers associated with mental health/substance abuse issues and Increase skills for wellness and recovery  Therapeutic Interventions: Assess for all discharge needs, 1 to 1 time with Social worker, Explore available resources and support systems, Assess for adequacy in community support network, Educate family and significant other(s) on suicide prevention, Complete Psychosocial Assessment, Interpersonal group therapy.  Evaluation of Outcomes: Progressing   Progress in Treatment: Attending groups: No. Participating in groups: No. Taking medication as prescribed: Yes. Toleration medication: Yes. Family/Significant other contact made: Yes, individual(s) contacted:  mother Patient understands diagnosis: Yes. Discussing patient identified problems/goals with staff: Yes. Medical problems stabilized or resolved: Yes. Denies suicidal/homicidal ideation: Yes. Issues/concerns per patient self-inventory: Yes.  New problem(s) identified: Yes, Describe:  No income  New Short Term/Long Term Goal(s): medication management for mood stabilization; elimination of SI thoughts; development of comprehensive mental wellness/sobriety plan.  Patient Goals:  Wants to get better sleep and medication management  Discharge Plan or Barriers: Discharge home and [facility]  Reason for Continuation of Hospitalization: Anxiety Delusions  Medication stabilization  Estimated Length of Stay: 1-3 days  Attendees: Patient: Bryan MilchDavion Brotz 05/14/2018   Physician: Dr. Jeannine KittenFarah, MD 05/14/2018    Nursing: Norma FredricksonMichael Scearce, RN 05/14/2018   RN Care Manager: 05/14/2018   Social Worker: Daleen SquibbGreg Oswaldo Cueto, LCSW 05/14/2018   Recreational Therapist:  05/14/2018   Other:    Other:  05/14/2018   Other: 05/14/2018        Scribe for Treatment Team: Lorri FrederickWierda, Keirstan Iannello Jon, LCSW 05/14/2018 10:43 AM

## 2018-11-23 ENCOUNTER — Other Ambulatory Visit: Payer: Self-pay

## 2018-11-23 ENCOUNTER — Encounter (HOSPITAL_COMMUNITY): Payer: Self-pay

## 2018-11-23 ENCOUNTER — Ambulatory Visit (HOSPITAL_COMMUNITY)
Admission: EM | Admit: 2018-11-23 | Discharge: 2018-11-23 | Disposition: A | Discharge status: Home / Self Care | Payer: Self-pay | Attending: Emergency Medicine | Admitting: Emergency Medicine

## 2018-11-23 DIAGNOSIS — Z20828 Contact with and (suspected) exposure to other viral communicable diseases: Secondary | ICD-10-CM | POA: Insufficient documentation

## 2018-11-23 DIAGNOSIS — Z79899 Other long term (current) drug therapy: Secondary | ICD-10-CM | POA: Insufficient documentation

## 2018-11-23 DIAGNOSIS — F329 Major depressive disorder, single episode, unspecified: Secondary | ICD-10-CM | POA: Insufficient documentation

## 2018-11-23 DIAGNOSIS — F419 Anxiety disorder, unspecified: Secondary | ICD-10-CM | POA: Insufficient documentation

## 2018-11-23 DIAGNOSIS — F1211 Cannabis abuse, in remission: Secondary | ICD-10-CM | POA: Insufficient documentation

## 2018-11-23 DIAGNOSIS — F209 Schizophrenia, unspecified: Secondary | ICD-10-CM | POA: Insufficient documentation

## 2018-11-23 DIAGNOSIS — J029 Acute pharyngitis, unspecified: Secondary | ICD-10-CM | POA: Insufficient documentation

## 2018-11-23 LAB — POCT RAPID STREP A: Streptococcus, Group A Screen (Direct): NEGATIVE

## 2018-11-23 NOTE — Discharge Instructions (Signed)
Negative rapid strep.  Throat lozenges, gargles, chloraseptic spray, warm teas, popsicles etc to help with throat pain.   Push fluids to ensure adequate hydration and keep secretions thin.  Continue with daily flonase and zyrtect. Self isolate until covid results are back and negative.  Will notify you by phone of any positive findings. Your negative results will be sent through your MyChart.     If symptoms worsen or do not improve in the next week to return to be seen or to follow up with your PCP.

## 2018-11-23 NOTE — ED Provider Notes (Signed)
[facility]    CSN: [id number] Arrival date & time: 11/23/18  1543      History   Chief Complaint Chief Complaint  Patient presents with  . Sore Throat    HPI Bryan Kirk is a 23 y.o. male.   Bryan Kirk presents with complaints of sore throat. Feels like his throat is "clogged" feels like it is difficult to swallow food, it is painful. This started 1 week ago. Not better, not worse. No fevers. No cough or congestion. No ear pain. No gi symptoms. No known ill contacts. Has tried using sore throat spray which does help some. Doesn't work currently. His mother does work outside the home, she has not been ill. History of anxiety and depression. Without contributing medical history.     ROS per HPI, negative if not otherwise mentioned.      Past Medical History:  Diagnosis Date  . Anxiety   . Depression     Patient Active Problem List   Diagnosis Date Noted  . Schizophrenia (HCC) 05/07/2018  . 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 Cannabis abuse 03/13/2018  . Psychosis (HCC) 12/18/2017  . 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 No pertinent surgical history.     Home Medications    Prior to Admission medications   Medication Sig Start Date End Date Taking? Authorizing Provider  ARIPiprazole ER (ABILIFY MAINTENA) 400 MG SRER injection Inject 2 mLs (400 mg total) into the muscle every 28 (twenty-eight) days. 05/14/18   Malvin JohnsFarah, Brian, MD  benztropine (COGENTIN) 1 MG tablet Take 1 tablet (1 mg total) by mouth 2 (two) times daily. 05/14/18   Malvin JohnsFarah, Brian, MD  cetirizine (ZYRTEC) 10 MG tablet Take 1 tablet (10 mg total) by mouth daily. Patient not taking: Reported on 05/05/2018 04/16/18   Belinda FisherYu, Amy V, PA-C  divalproex (DEPAKOTE) 500 MG DR tablet Take 500 mg by mouth at bedtime.     [provider]  fluticasone (FLONASE) 50 MCG/ACT nasal spray Place 2 sprays into both nostrils  daily. Patient taking differently: Place 2 sprays into both nostrils daily as needed for allergies or rhinitis.  04/16/18   Cathie HoopsYu, Amy V, PA-C  omega-3 acid ethyl esters (LOVAZA) 1 g capsule Take 1 capsule (1 g total) by mouth 2 (two) times daily. 05/14/18   Malvin JohnsFarah, Brian, MD  omeprazole (PRILOSEC) 20 MG capsule Take 1 capsule (20 mg total) by mouth daily. Patient taking differently: Take 20 mg by mouth daily as needed (acid reflux).  05/04/18   Georgetta HaberBurky, Natalie B, NP  propranolol (INDERAL) 20 MG tablet Take 1 tablet (20 mg total) by mouth 2 (two) times daily. 05/14/18   Malvin JohnsFarah, Brian, MD  risperiDONE (RISPERDAL) 3 MG tablet Take 2 tablets (6 mg total) by mouth at bedtime. 05/14/18   Malvin JohnsFarah, Brian, MD  temazepam (RESTORIL) 15 MG capsule Take 1 capsule (15 mg total) by mouth at bedtime. 05/14/18   Malvin JohnsFarah, Brian, MD    Family History Family History  Problem Relation Age of Onset  . Healthy Mother   . Healthy Father     Social History Social History   Tobacco Use  . Smoking status: Never Smoker  . Smokeless tobacco: Never Used  Substance Use Topics  . Alcohol use: Never    Frequency: Never  . Drug use: Yes    Types: Marijuana    Comment: last use 12/13/17     Allergies   Patient has  no known allergies.   Review of Systems Review of Systems   Physical Exam Triage Vital Signs ED Triage Vitals  Enc Vitals Group     BP 11/23/18 1645 125/75     Pulse Rate 11/23/18 1645 86     Resp 11/23/18 1645 18     Temp 11/23/18 1645 98.5 F (36.9 C)     Temp Source 11/23/18 1645 Oral     SpO2 11/23/18 1645 95 %     Weight --      Height --      Head Circumference --      Peak Flow --      Pain Score 11/23/18 1647 6     Pain Loc --      Pain Edu? --      Excl. in [place]? --    No data found.  Updated Vital Signs BP 125/75 (BP Location: Right Arm)   Pulse 86   Temp 98.5 F (36.9 C) (Oral)   Resp 18   SpO2 95%    Physical Exam Constitutional:      Appearance: He is well-developed.  HENT:      Mouth/Throat:     Mouth: Mucous membranes are moist.     Pharynx: Posterior oropharyngeal erythema present.     Tonsils: No tonsillar exudate. 1+ on the right. 1+ on the left.  Cardiovascular:     Rate and Rhythm: Normal rate and regular rhythm.  Pulmonary:     Effort: Pulmonary effort is normal.     Breath sounds: Normal breath sounds.  Lymphadenopathy:     Cervical: No cervical adenopathy.  Skin:    General: Skin is warm and dry.  Neurological:     Mental Status: He is alert and oriented to person, place, and time.      UC Treatments / Results  Labs (all labs ordered are listed, but only abnormal results are displayed) Labs Reviewed  NOVEL CORONAVIRUS, NAA (HOSP ORDER, SEND-OUT TO REF LAB; TAT 18-24 HRS)  CULTURE, GROUP A STREP [facility])  POCT RAPID STREP A    EKG   Radiology No results found.  Procedures Procedures (including critical care time)  Medications Ordered in UC Medications - No data to display  Initial Impression / Assessment and Plan / UC Course  I have reviewed the triage vital signs and the nursing notes.  Pertinent labs & imaging results that were available during my care of the patient were reviewed by me and considered in my medical decision making (see chart for details).     Non toxic. Benign physical exam.  Afebrile. Negative rapid strep. Culture pending. covid testing pending. Supportive cares recommended. Return precautions provided. Patient verbalized understanding and agreeable to plan.   Final Clinical Impressions(s) / UC Diagnoses   Final diagnoses:  Pharyngitis, unspecified etiology     Discharge Instructions     Negative rapid strep.  Throat lozenges, gargles, chloraseptic spray, warm teas, popsicles etc to help with throat pain.   Push fluids to ensure adequate hydration and keep secretions thin.  Continue with daily flonase and zyrtect. Self isolate until covid results are back and negative.  Will notify you by phone of any  positive findings. Your negative results will be sent through your MyChart.     If symptoms worsen or do not improve in the next week to return to be seen or to follow up with your PCP.      ED Prescriptions    None  PDMP not reviewed this encounter.   Georgetta Haber, NP 11/23/18 [phone]

## 2018-11-23 NOTE — ED Triage Notes (Signed)
Pt presents with sore throat X 1 week. 

## 2018-11-25 LAB — NOVEL CORONAVIRUS, NAA (HOSP ORDER, SEND-OUT TO REF LAB; TAT 18-24 HRS): SARS-CoV-2, NAA: NOT DETECTED

## 2018-11-26 LAB — CULTURE, GROUP A STREP (THRC)

## 2019-10-20 IMAGING — DX CHEST - 2 VIEW
2 series · 2 of 2 positions shown · non-contrast
Comparison: Radiographs September 01, 2017.

CLINICAL DATA: Chest pain.

EXAM:
CHEST - 2 VIEW

[chest pa]
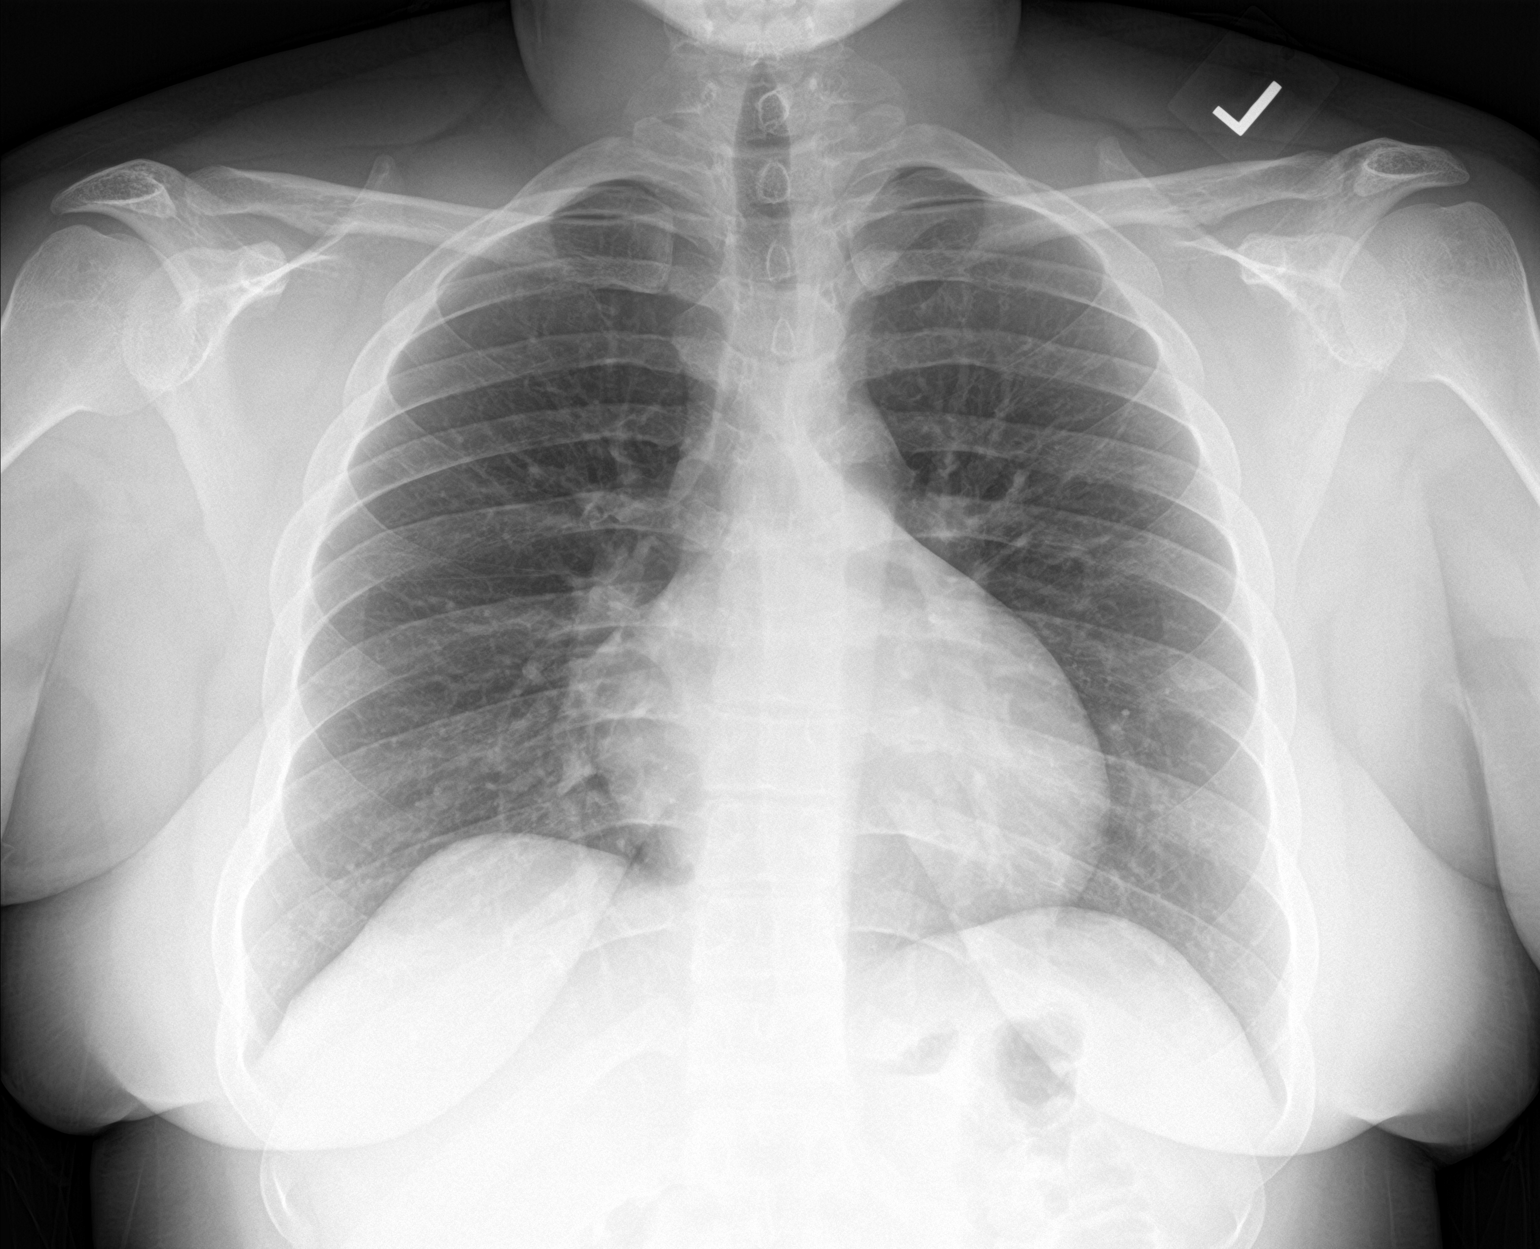

[chest lat]
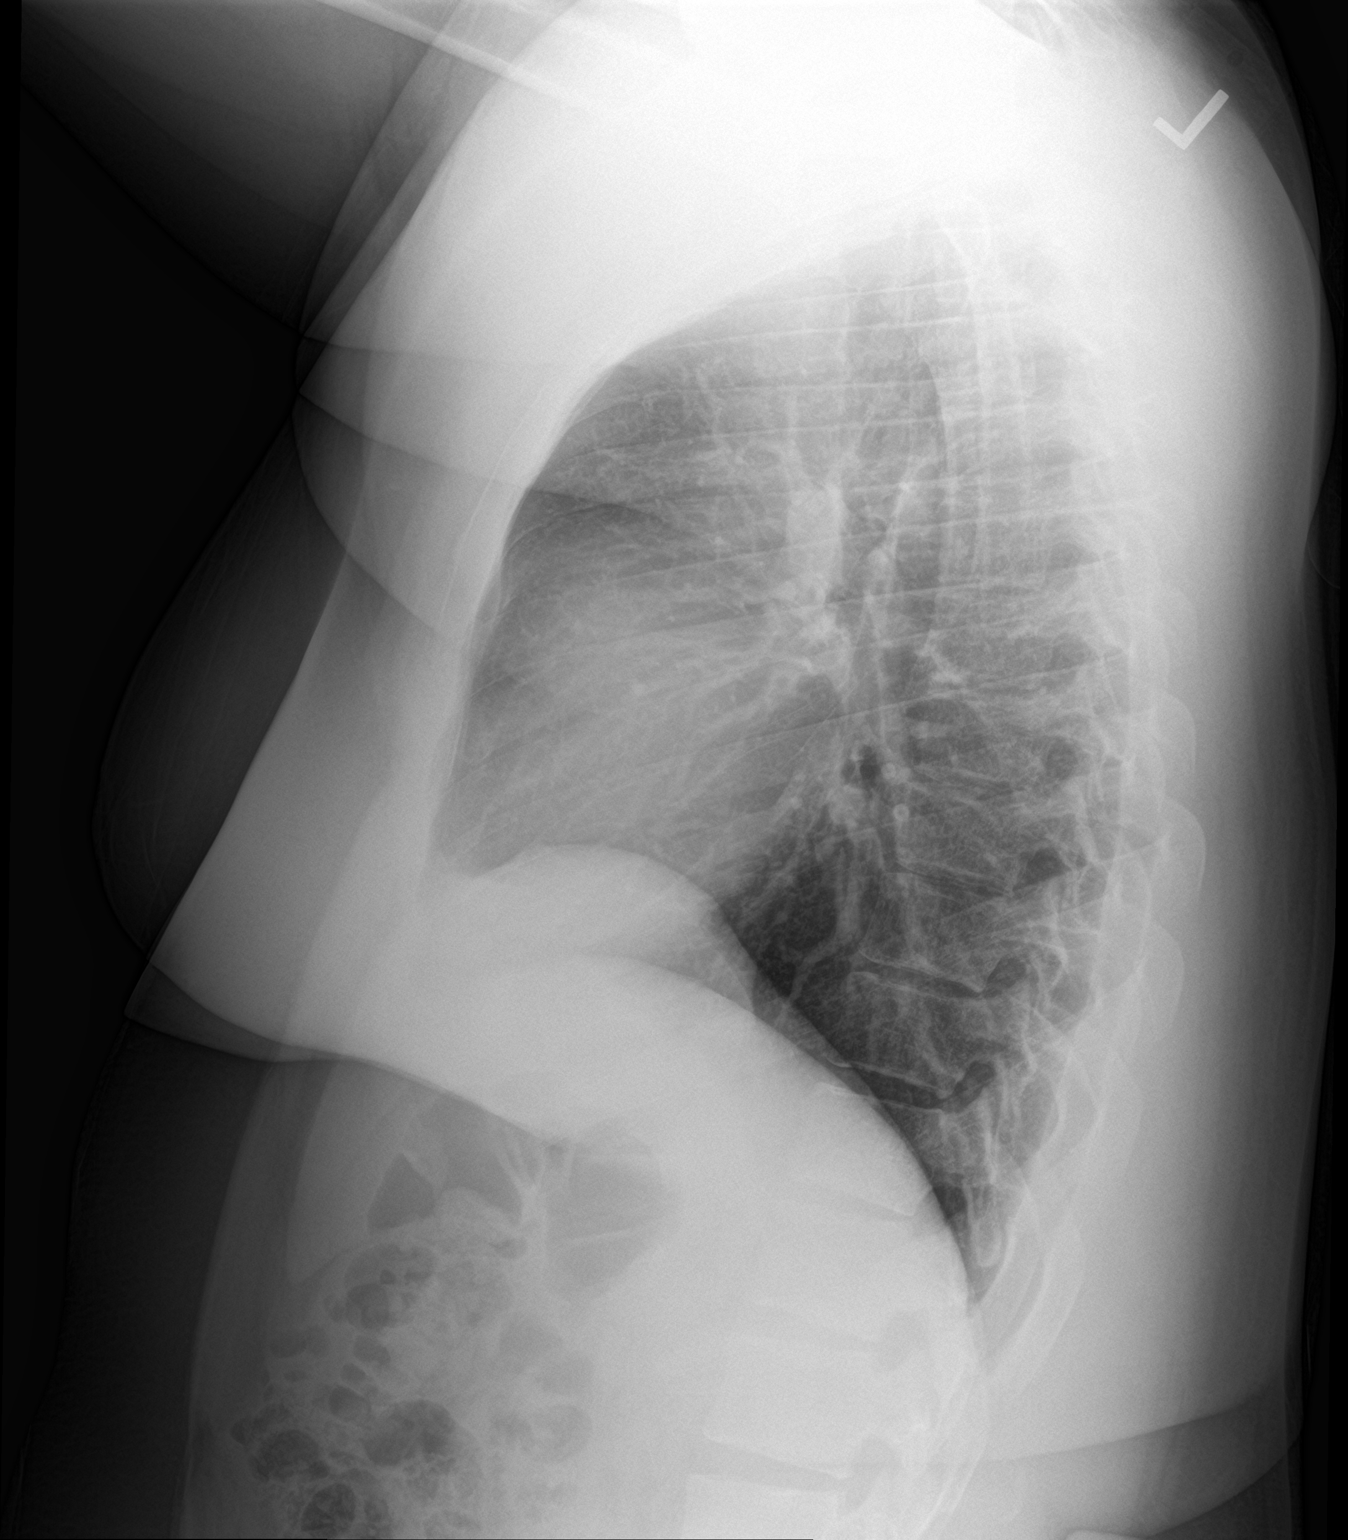

[2 of 2 positions shown; findings below may reference images not displayed]

FINDINGS: The heart size and mediastinal contours are within normal limits.
Both lungs are clear. No pneumothorax or pleural effusion is noted.
The visualized skeletal structures are unremarkable.
IMPRESSION: No active cardiopulmonary disease.

## 2019-10-21 IMAGING — CR CHEST - 2 VIEW
2 series · 2 of 2 positions shown · non-contrast
Comparison: 05/04/2018

CLINICAL DATA: Chest pain

EXAM:
CHEST - 2 VIEW

[chest pa]
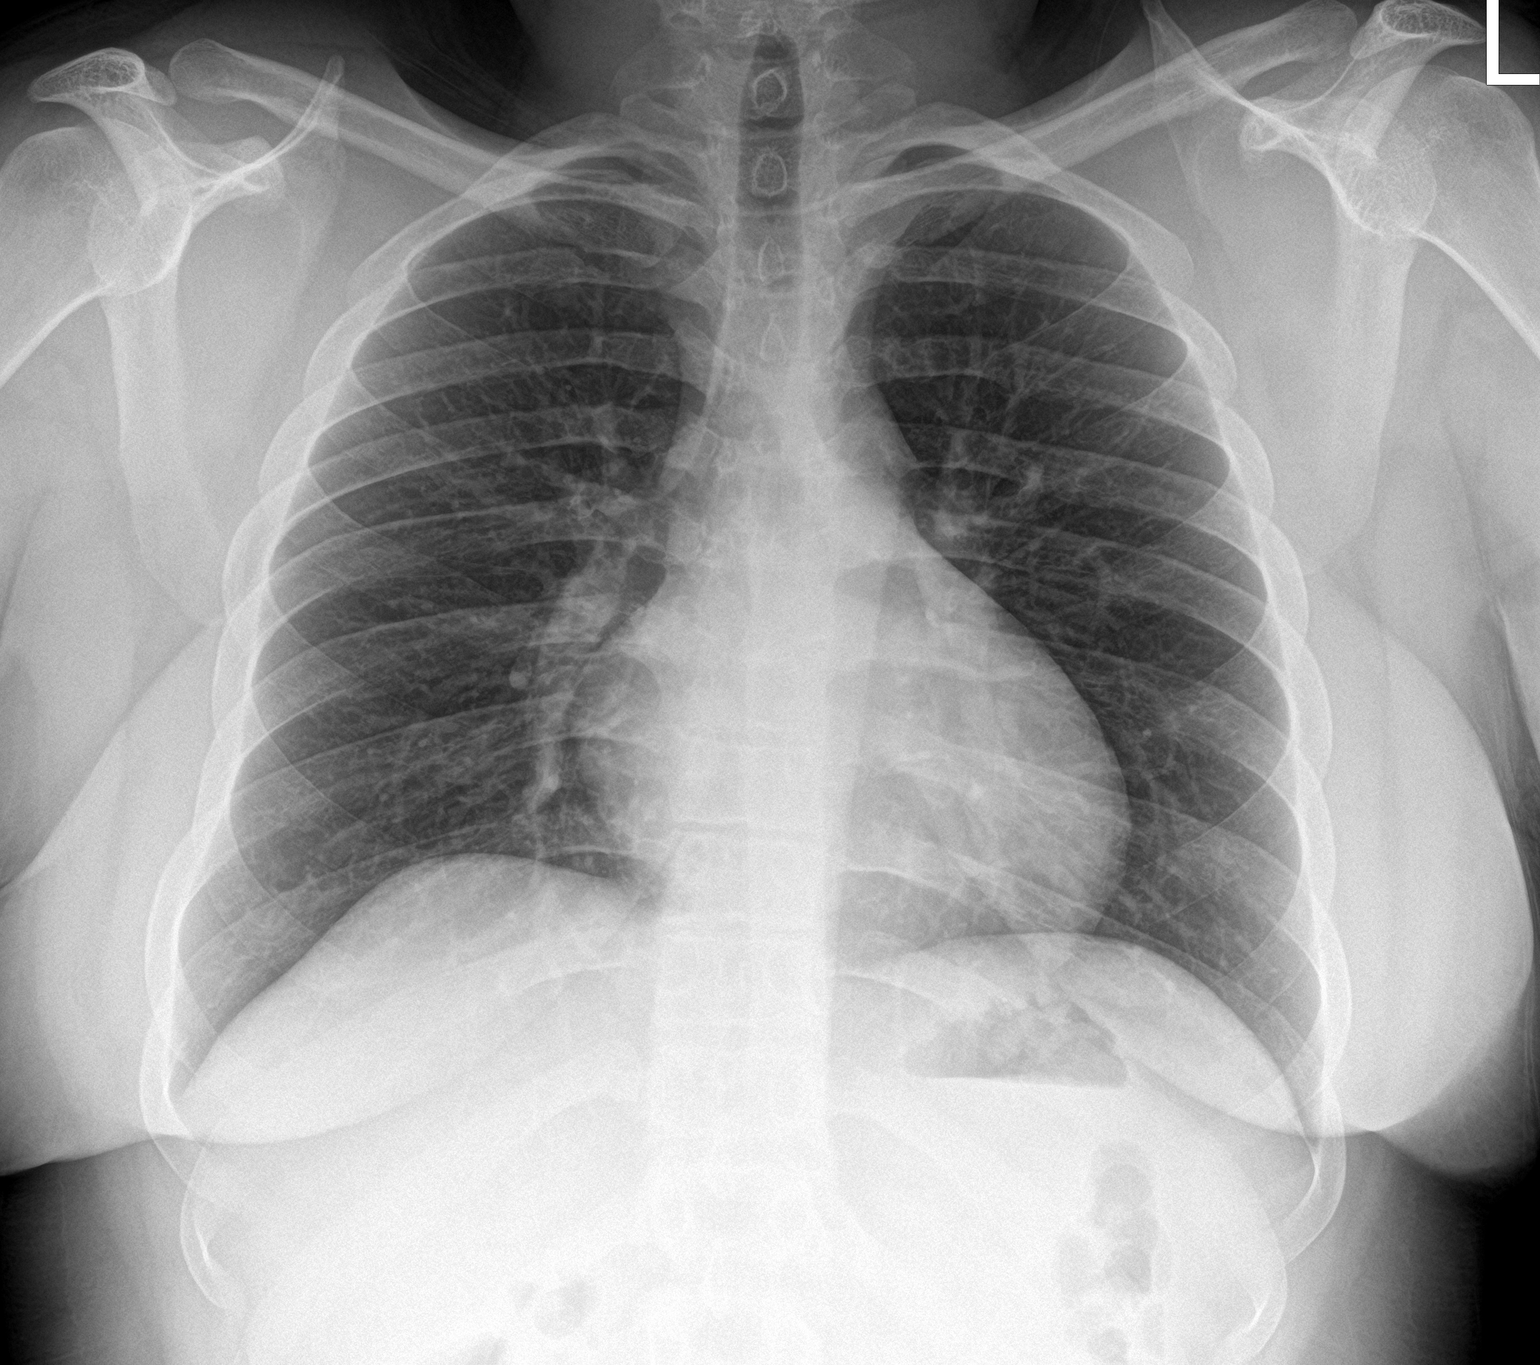

[chest lat]
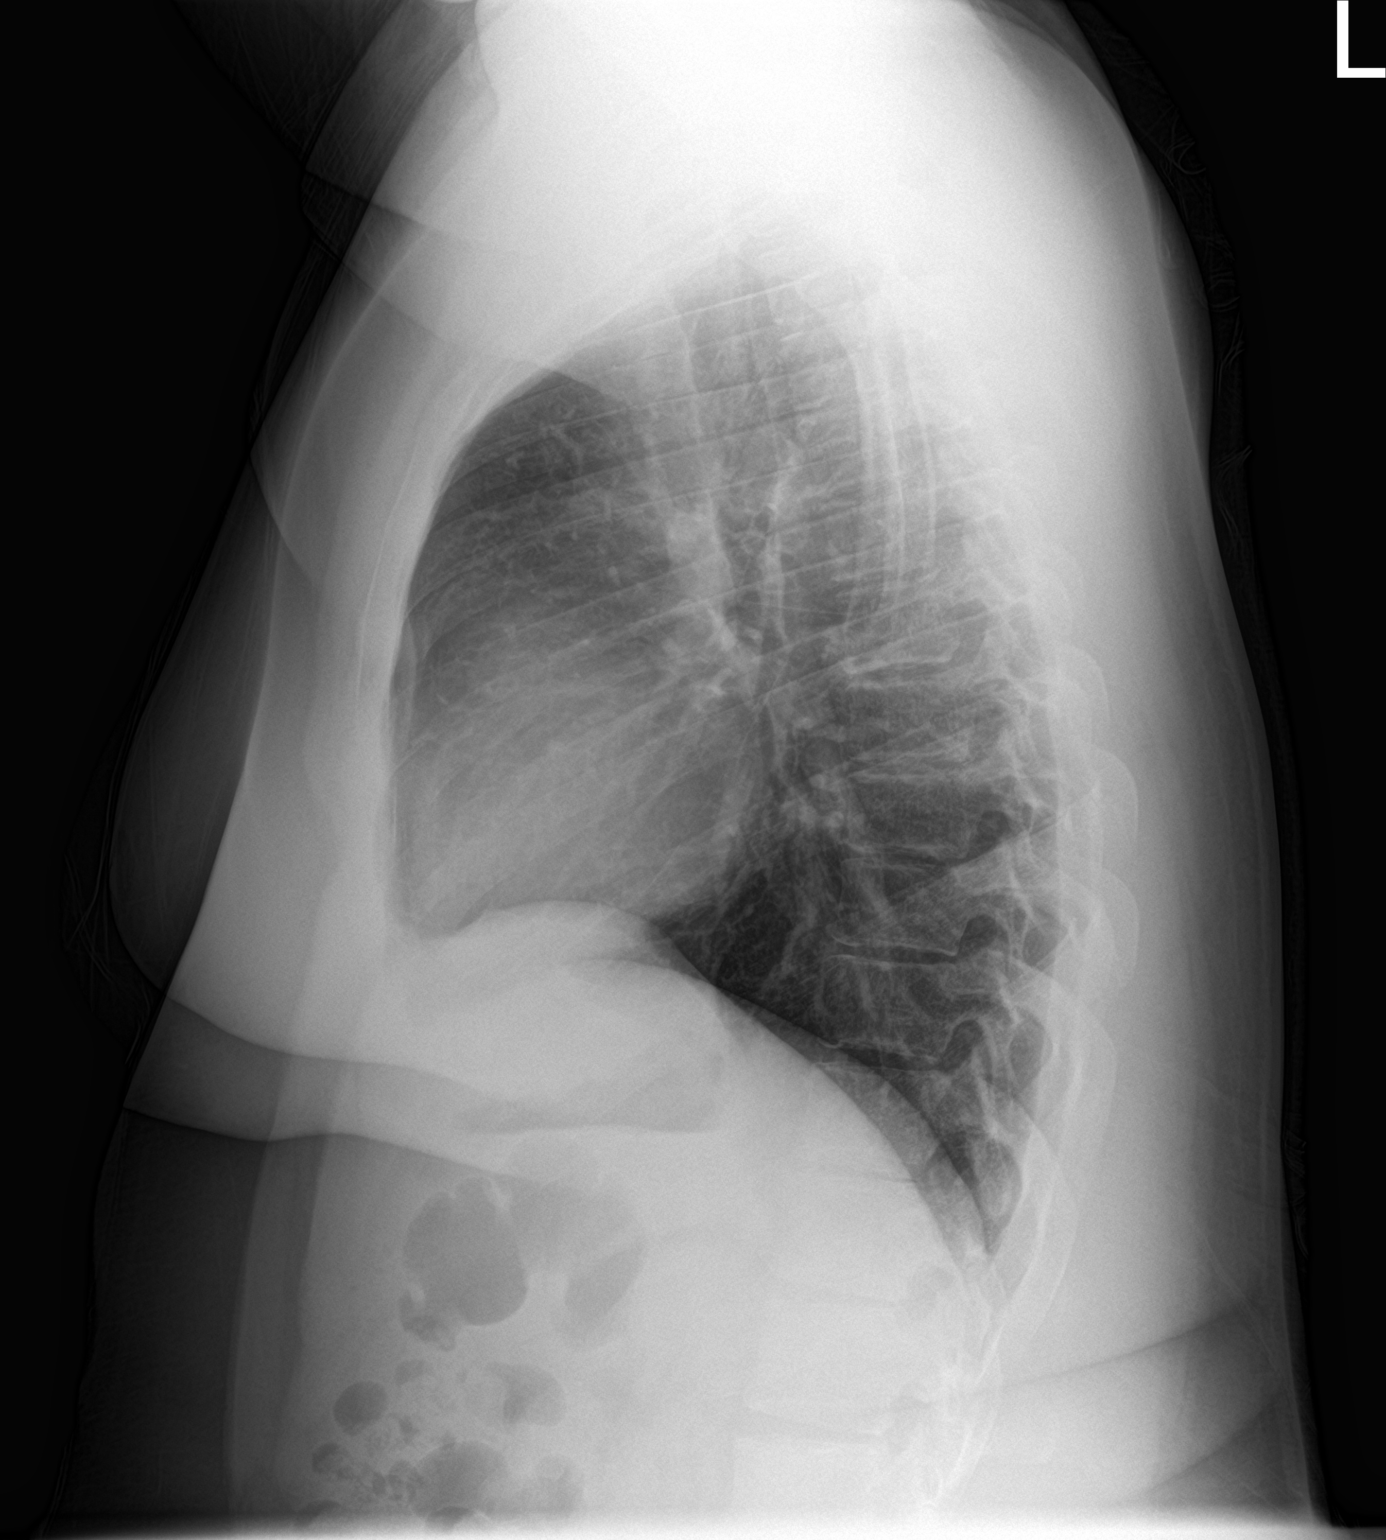

[2 of 2 positions shown; findings below may reference images not displayed]

FINDINGS: The heart size and mediastinal contours are within normal limits.
Both lungs are clear. The visualized skeletal structures are
unremarkable.
IMPRESSION: No active cardiopulmonary disease.
